# Patient Record
Sex: Male | Born: 1965
Health system: Southern US, Community
[De-identification: ages and names within clinical notes are randomized; demographics above are authoritative.]

## PROBLEM LIST (undated history)

## (undated) DIAGNOSIS — E785 Hyperlipidemia, unspecified: Secondary | ICD-10-CM

## (undated) DIAGNOSIS — E118 Type 2 diabetes mellitus with unspecified complications: Secondary | ICD-10-CM

## (undated) DIAGNOSIS — Z794 Long term (current) use of insulin: Secondary | ICD-10-CM

## (undated) DIAGNOSIS — J45909 Unspecified asthma, uncomplicated: Secondary | ICD-10-CM

## (undated) DIAGNOSIS — E119 Type 2 diabetes mellitus without complications: Secondary | ICD-10-CM

## (undated) DIAGNOSIS — I2510 Atherosclerotic heart disease of native coronary artery without angina pectoris: Secondary | ICD-10-CM

## (undated) DIAGNOSIS — I213 ST elevation (STEMI) myocardial infarction of unspecified site: Secondary | ICD-10-CM

## (undated) HISTORY — DX: Unspecified asthma, uncomplicated: J45.909

## (undated) HISTORY — DX: Atherosclerotic heart disease of native coronary artery without angina pectoris: I25.10

## (undated) HISTORY — DX: Type 2 diabetes mellitus with unspecified complications: E11.8

## (undated) HISTORY — DX: Long term (current) use of insulin: Z79.4

---

## 2005-10-08 ENCOUNTER — Encounter: Admission: RE | Admit: 2005-10-08 | Discharge: 2005-10-08 | Payer: Self-pay | Admitting: Occupational Medicine

## 2006-01-24 ENCOUNTER — Emergency Department (HOSPITAL_COMMUNITY): Admission: EM | Admit: 2006-01-24 | Discharge: 2006-01-24 | Payer: Self-pay | Admitting: Emergency Medicine

## 2006-02-08 ENCOUNTER — Ambulatory Visit: Payer: Self-pay | Admitting: Family Medicine

## 2006-03-25 ENCOUNTER — Emergency Department (HOSPITAL_COMMUNITY): Admission: EM | Admit: 2006-03-25 | Discharge: 2006-03-25 | Payer: Self-pay | Admitting: Family Medicine

## 2006-04-20 ENCOUNTER — Emergency Department (HOSPITAL_COMMUNITY): Admission: EM | Admit: 2006-04-20 | Discharge: 2006-04-20 | Payer: Self-pay | Admitting: Family Medicine

## 2007-11-11 ENCOUNTER — Emergency Department (HOSPITAL_COMMUNITY): Admission: EM | Admit: 2007-11-11 | Discharge: 2007-11-11 | Payer: Self-pay | Admitting: Emergency Medicine

## 2007-11-14 ENCOUNTER — Ambulatory Visit: Payer: Self-pay | Admitting: Family Medicine

## 2007-12-09 ENCOUNTER — Ambulatory Visit: Payer: Self-pay | Admitting: Family Medicine

## 2011-08-17 LAB — POCT RAPID STREP A: Streptococcus, Group A Screen (Direct): NEGATIVE

## 2011-11-26 ENCOUNTER — Ambulatory Visit (INDEPENDENT_AMBULATORY_CARE_PROVIDER_SITE_OTHER): Payer: BC Managed Care – PPO

## 2011-11-26 DIAGNOSIS — Z8611 Personal history of tuberculosis: Secondary | ICD-10-CM

## 2011-11-26 DIAGNOSIS — R05 Cough: Secondary | ICD-10-CM

## 2011-12-18 ENCOUNTER — Ambulatory Visit (INDEPENDENT_AMBULATORY_CARE_PROVIDER_SITE_OTHER): Payer: BC Managed Care – PPO | Admitting: Internal Medicine

## 2011-12-18 VITALS — BP 128/90 | HR 78 | Temp 98.2°F | Resp 16 | Ht 59.0 in | Wt 136.0 lb

## 2011-12-18 DIAGNOSIS — J988 Other specified respiratory disorders: Secondary | ICD-10-CM

## 2011-12-18 DIAGNOSIS — J22 Unspecified acute lower respiratory infection: Secondary | ICD-10-CM

## 2011-12-18 NOTE — Progress Notes (Signed)
  Subjective:    Patient ID: Mark Cole, male    DOB: July 01, 1966, 46 y.o.   MRN: [id number]  HPIhe returns after treatment 1 14 2013 4 bronchospasm. He is a known asthmatic but rarely needs medicines. The treatment was ineffective and he continues to cough both day and night and has shortness of breath with activity. He has no fever and the cough is nonproductive. He has a mild sore throat from coughing and also tender abdominal muscles from coughing.    Review of Systemsnote a past history of tuberculosis which was treated with INH and a normal x-ray 2 weeks ago.     Objective:   Physical Examphysical examination shows intact HEENT except for mild redness in the pharynx. There are no lymph nodes in the neck. The lungs have forced expiratory wheezing bilaterally  And rhonchi at the bases that clear with coughing.        Assessment & Plan:  Problem #1 lower respiratory infection with exacerbation of asthma.  Plan-Zpak Hycodan Prednisone followup if not well in 7 days

## 2012-07-10 ENCOUNTER — Ambulatory Visit (INDEPENDENT_AMBULATORY_CARE_PROVIDER_SITE_OTHER): Payer: BC Managed Care – PPO | Admitting: Emergency Medicine

## 2012-07-10 ENCOUNTER — Ambulatory Visit: Payer: BC Managed Care – PPO

## 2012-07-10 VITALS — BP 121/83 | HR 96 | Temp 97.7°F | Resp 17 | Ht 58.5 in | Wt 131.0 lb

## 2012-07-10 DIAGNOSIS — R059 Cough, unspecified: Secondary | ICD-10-CM

## 2012-07-10 DIAGNOSIS — R070 Pain in throat: Secondary | ICD-10-CM

## 2012-07-10 DIAGNOSIS — J40 Bronchitis, not specified as acute or chronic: Secondary | ICD-10-CM

## 2012-07-10 DIAGNOSIS — R05 Cough: Secondary | ICD-10-CM

## 2012-07-10 DIAGNOSIS — J45909 Unspecified asthma, uncomplicated: Secondary | ICD-10-CM

## 2012-07-10 DIAGNOSIS — J029 Acute pharyngitis, unspecified: Secondary | ICD-10-CM

## 2012-07-10 LAB — POCT RAPID STREP A (OFFICE): Rapid Strep A Screen: NEGATIVE

## 2012-07-10 MED ORDER — ALBUTEROL SULFATE HFA 108 (90 BASE) MCG/ACT IN AERS: 2.0000 | INHALATION_SPRAY | RESPIRATORY_TRACT | Status: DC | PRN

## 2012-07-10 MED ORDER — PREDNISONE 20 MG PO TABS: ORAL_TABLET | ORAL | Status: DC

## 2012-07-10 MED ORDER — ALBUTEROL SULFATE (2.5 MG/3ML) 0.083% IN NEBU
2.5000 mg | INHALATION_SOLUTION | Freq: Once | RESPIRATORY_TRACT | Status: AC
  Administered 2012-07-10: 2.5 mg via RESPIRATORY_TRACT

## 2012-07-10 MED ORDER — AZITHROMYCIN 250 MG PO TABS: ORAL_TABLET | ORAL | Status: DC

## 2012-07-10 MED ORDER — AZITHROMYCIN 250 MG PO TABS: ORAL_TABLET | ORAL | Status: AC

## 2012-07-10 NOTE — Patient Instructions (Addendum)
Vim ph? qu?n (Bronchitis) Vim ph? qu?n l cch c? th? ph?n ?ng l?i s? t?n th??ng v/ho?c nhi?m trng (vim) c?a ph? qu?n. Ph? qu?n l ?ng d?n kh ko di t? kh qu?n ??n ph?i. Khi vim nhi?m n?ng c th? gy kh th?. NGUYN NHN S? vim nhi?m c th? gy ra b?i:  Vi rt.   Vi trng (vi khu?n).   B?i.   Ch?t gy d? ?ng.   Ch?t b?n v nhi?u ch?t kch thch khc.  Cc t? bo t?o thnh cu?ng ph?i ???c bao b?c b?i lng t? (mao). Nh?ng lng t? ny lin t?c ??p ln ra xa ph?i v? pha mi?ng. Nh? ? ng?n khng cho cc ch?t b?n ?i vo ph?i. Khi nh?ng t? bo ny tr? nn qu kch thch v khng th? th?c hi?n ch?c n?ng c?a chng, d?ch nh?y s? b?t ??u pht tri?n. ?i?u ny gy ra ho ??c tr?ng c?a vim ph? qu?n. Ho s? lm s?ch ph?i khi lng mao khng th? th?c hi?n ch?c n?ng c?a chng. Khi khng c c? hai c? c?u b?o v? ny, d?ch nh?y s? ??ng l?i trong ph?i. Khi ? vim ph?i s? pht tri?n.  Ht thu?c l l nguyn nhn ph? bi?n c?a vim ph? qu?n v c th? gp ph?n vo vim ph?i. Vi?c d?ng thi quen ny l m?t ?i?u quan tr?ng nh?t m b?n c th? lm ?? t? gip mnh. ?I?U TR?  Chuyn gia ch?m [place] y t? c th? k thu?c khng sinh cho b?n n?u b?n b? ho do vi khu?n. Ngoi ra, thu?c gip lm gin kh qu?n ?? b?n th? d? h?n. Chuyn gia ch?m [place] y t? c?ng c th? ?? xu?t ho?c k thu?c long ??m. Thu?c long ??m s? lm l?ng d?ch nh?y ?? c th? ho ra. Ch? s? d?ng thu?c mua tr?c ti?p t?i qu?y ho?c thu?c theo toa ?? gi?m ?au, gi?m s? kh ch?u ho?c h? s?t theo ch? d?n c?a chuyn gia ch?m [place] y t? c?a b?n.   Vi?c lo?i b? b?t c? ?i?u g gy ra v?n ?? (v d? nh? ht thu?c l) c  ngh?a quan tr?ng ??i v?i vi?c ng?n khng lm cho v?n ?? t?i t? h?n.   Thu?c ho c th? ???c k ?? ?i?u tr? tri?u ch?ng ho.   Thu?c ht c th? ???c k ?? gip ?i?u tr? cc tri?u ch?ng hi?n t?i v ?? gip ng?n ng?a v?n ?? l?p l?i.   Nh?ng ng??i b? vim ph? qu?n mn tnh (ti h?i) c th? c?n ph?i dng thu?c steroid.  HY NGAY L?P T?C THAM V?N V?I CHUYN  GIA Y T? N?U:  Trong qu trnh ?i?u tr? b?n pht tri?n ??m gi?ng m? h?n (c m?).   B?n b? s?t.   Tr? h?n 3 thng tu?i c nhi?t ?? ?o ? h?u mn l 102 F (38,9 C) ho?c cao h?n.   Tr? 3 thng tu?i ho?c nh? h?n c nhi?t ?? ?o ? h?u mn l 100,4 F (38 C) ho?c cao h?n.   Tnh tr?ng b?nh c?a b?n tr? nn n?ng h?n.   B?n th?y kh th? h?n, th? kh kh hay kh th?.  C?n tham v?n v?i chuyn gia y t? ngay l?p t?c n?u b?n l ng??i l?n tu?i ho?c ?ang b? b?t k? b?nh no khc. ??M B?O B?N:   Hi?u cc h??ng d?n ny.   S? theo di tnh tr?ng c?a mnh.   S? yu c?u tr? gip ngay l?p t?c n?u b?n c?m  th?y khng kh?e ho?c tnh tr?ng tr? nn t?i t? h?n.  Document Released: 10/29/2005 Document Revised: 10/18/2011 [facility] Patient Information 2012 [place], [place].Bronchitis Bronchitis is the body's way of reacting to injury and/or infection (inflammation) of the bronchi. Bronchi are the air tubes that extend from the windpipe into the lungs. If the inflammation becomes severe, it may cause shortness of breath. CAUSES  Inflammation may be caused by:  A virus.   Germs (bacteria).   Dust.   Allergens.   Pollutants and many other irritants.  The cells lining the bronchial tree are covered with tiny hairs (cilia). These constantly beat upward, away from the lungs, toward the mouth. This keeps the lungs free of pollutants. When these cells become too irritated and are unable to do their job, mucus begins to develop. This causes the characteristic cough of bronchitis. The cough clears the lungs when the cilia are unable to do their job. Without either of these protective mechanisms, the mucus would settle in the lungs. Then you would develop pneumonia. Smoking is a common cause of bronchitis and can contribute to pneumonia. Stopping this habit is the single most important thing you can do to help yourself. TREATMENT   Your caregiver may prescribe an antibiotic if the cough is caused by bacteria. Also,  medicines that open up your airways make it easier to breathe. Your caregiver may also recommend or prescribe an expectorant. It will loosen the mucus to be coughed up. Only take over-the-counter or prescription medicines for pain, discomfort, or fever as directed by your caregiver.   Removing whatever causes the problem (smoking, for example) is critical to preventing the problem from getting worse.   Cough suppressants may be prescribed for relief of cough symptoms.   Inhaled medicines may be prescribed to help with symptoms now and to help prevent problems from returning.   For those with recurrent (chronic) bronchitis, there may be a need for steroid medicines.  SEEK IMMEDIATE MEDICAL CARE IF:   During treatment, you develop more pus-like mucus (purulent sputum).   You have a fever.   Your baby is older than 3 months with a rectal temperature of 102 F (38.9 C) or higher.   Your baby is 36 months old or younger with a rectal temperature of 100.4 F (38 C) or higher.   You become progressively more ill.   You have increased difficulty breathing, wheezing, or shortness of breath.  It is necessary to seek immediate medical care if you are elderly or sick from any other disease. MAKE SURE YOU:   Understand these instructions.   Will watch your condition.   Will get help right away if you are not doing well or get worse.  Document Released: 10/29/2005 Document Revised: 10/18/2011 Document Reviewed: 09/07/2008 [facility] Patient Information 2012 [place], [place].

## 2012-07-10 NOTE — Progress Notes (Signed)
  Subjective:    Patient ID: Mark Cole, male    DOB: 01-16-1966, 46 y.o.   MRN: [id number]  HPI patient states he has no history of problems until 2 weeks ago he started having a sore throat and itching sensation in his throat as well as a bad cough. He states he's had some wheezing with this he is unable to stop coughing and coughs up only small amounts of phlegm.    Review of Systems     Objective:   Physical Exam  Constitutional: He appears well-developed and well-nourished.  HENT:  Head: Normocephalic.  Right Ear: External ear normal.  Left Ear: External ear normal.       The throat is red but I did not see any exudate. He did not have cervical adenopathy.  Eyes: Pupils are equal, round, and reactive to light.  Neck: No thyromegaly present.  Cardiovascular: Normal rate and regular rhythm.   Pulmonary/Chest: Effort normal. He has wheezes. He has no rales.       Decreased breath sounds in the bases   Results for orders placed in visit on 07/10/12  POCT RAPID STREP A (OFFICE)      Component Value Range   Rapid Strep A Screen Negative  Negative     [facility] reading (PRIMARY) by  Dr. Cleta Alberts chest x-ray shows no acute disease .     Assessment & Plan:  I suspect his symptoms are allergy related. He did improve with nebulizer treatment. We'll treat with a short taper of prednisone as well as a Z-Pak and see if this will resolve his symptoms. His pulse ox was slightly low at 95%

## 2013-09-06 ENCOUNTER — Ambulatory Visit (INDEPENDENT_AMBULATORY_CARE_PROVIDER_SITE_OTHER): Payer: Self-pay | Admitting: Emergency Medicine

## 2013-09-06 VITALS — BP 130/90 | HR 75 | Temp 98.0°F | Resp 16 | Ht 58.5 in | Wt 135.0 lb

## 2013-09-06 DIAGNOSIS — J45909 Unspecified asthma, uncomplicated: Secondary | ICD-10-CM

## 2013-09-06 MED ORDER — ALBUTEROL SULFATE HFA 108 (90 BASE) MCG/ACT IN AERS: 2.0000 | INHALATION_SPRAY | RESPIRATORY_TRACT | Status: DC | PRN

## 2013-09-06 NOTE — Patient Instructions (Signed)
Mark Cole, Mark Cole  (Asthma, Adult)  Mark Cole l tnh tr?ng ?nh h??ng t?i ph?i. N ???c ??c tr?ng b?i s?ng v h?p ???ng h h?p c?ng nh? t?ng ti?t d?ch nh?y. H?p l do s?ng v co th?t c? bn trong ???ng h h?p. Khi ?i?u ny x?y ra, th? c th? kh kh?n v b?n c th? c ho, th? kh kh v th? d?c. Cng hi?u nhi?u v? Mark Cole th cng c th? gip b?n ch? ng? n t?t h?n. Khng th? ch?a kh?i Mark Cole, nh?ng thu?c v thay ??i cch s?ng c th? gip ki?m sot n. Mark Cole c th? l v?n ?? nh? ??i v?i m?t s? Mark nh?ng n?u Mark Cole khng ???c ki?m sot , n c th? d?n t?i c?n Mark ?e d?a ??n tnh m?ng. Mark Cole c th? thay ??i theo th?i gian. ?i?u quan tr?ng l c?n ph?i h?p v?i chuyn gia ch?m [place] y t? c?a b?n ?? ch? ng? tri?u ch?ng Mark Cole. NGUYN NHN  Nguyn nhn chnh xc gy Mark Cole khng ???c xc ??nh. Mark Cole ???c cho l do di truy?n (di truy?n) v ti?p xc v?i mi tr??ng. S?ng v t?y ?? (vim) ???ng h h?p x?y ra trong Mark Cole. ?i?u ny c th? b? kch thch b?i d? ?ng, nhi?m trng ph?i do vi rt, ho?c cc ch?t kch thch trong khng kh. Ph?n ?ng d? ?ng c th? khi?n b?n th? kh kh ngay l?p t?c ho?c vi gi? sau khi ti?p xc. Tc nhn gy Mark Cole khc nhau ??i v?i t?ng Mark. ?i?u quan tr?ng l ph?i ch  v bi?t nguyn nhn gy ra Mark Cole.  Tc nhn ph? bi?n gy ra c?n Mark bao g?m:   B?i b?n t? da, tc ??ng v?t ho?c lng ??ng v?t.  M?t b?i c trong b?i trong nh.  Gin.  Ph?n hoa t? cy ho?c c?.  N?m m?c.  Thu?c l ho?c khi thu?c l. Khng th? cho php ht thu?c trong nh c Mark b? Mark Cole. Mark b? Mark Cole khng nn ht thu?c v khng nn ? g?n Mark ht thu?c.  Cc ch?t  nhi?m khng kh nh? b?i, ch?t t?y r?a gia d?ng, ch?t x?t tc, ch?t phun x?t, h?i s?n, ha ch?t m?nh ho?c mi m?nh.  Khng kh l?nh ho?c thay ??i th?i ti?t. Khng kh l?nh c th? gy vim. Gi lm t?ng n?m m?c, ph?n hoa trong khng kh. Khng c m?t kh h?u t?t nh?t cho Mark b? Mark Cole.  Nh?ng c?m  xc m?nh nh? khc ho?c c??i nhi?u.  S? c?ng th?ng.  M?t s? lo?i thu?c c? th? nh? atpirin ho?c thu?c ch?n beta.  [employer] trong cc lo?i th?c ph?m v ?? u?ng nh? tri cy kh v r??u vang.  Cc tnh tr?ng nhi?m trng ho?c vim nh? cm, c?m l?nh ho?c vim mng m?i (vim m?i ).  B?nh tro ng??c d? dy th?c qu?n (GERD). GERD l tnh tr?ng axit trong d? dy tro ng??c ln c? h?ng (th?c qu?n).  T?p luy?n ho?c ho?t ??ng v?t v?. Thu?c tr??c khi t?p th? d?c thch h?p cho php h?u h?t m?i Mark tham gia cc mn th? thao. TRI?U CH?NG   C?m th?y th? d?c.  T?c ho?c ?au ng?c.  Kh ng? do ho, th? kh kh ho?c c?m th?y th? d?c.  m thanh hut so hay th? kh kh v?i vi?c th? ra.  Ho ho?c th? kh kh t? h?n khi b?n:  B? nhi?m vi  rt (nh? c?m l?nh ho?c cm).  ?ang b? d? ?ng.  Ti?p xc v?i m?t s? lo?i h?i ho?c ha ch?t nh?t ??nh.  T?p luy?n. D?u hi?u cho th?y Mark Cole c th? tr? nn t? h?n bao g?m:   D?u hi?u v tri?u ch?ng c?a Mark Cole th??ng xuyn h?n v kh ch?u.  T?ng hi?n t??ng kh th?. Hi?n t??ng ny c th? ???c ?o b?ng my ?o l?u l??ng ??nh, ?y l m?t thi?t b? ??n gi?n ???c s? d?ng ?? ki?m tra xem ph?i c?a b?n ?ang ho?t ??ng nh? th? no.  Ngy cng c?n s? d?ng ?ng ht gi?m ?au nhanh th??ng xuyn h?n. CH?N ?ON  Vi?c ch?n ?on Mark Cole ???c th?c hi?n b?ng cch xem xt b?nh s? c?a b?n, khm tr?c ti?p v c th? t? cc xt nghi?m khc. Nghin c?u ch?c n?ng ph?i c th? gip ch?n ?on.  ?I?U TR?  Khng th? ch?a kh?i Mark Cole. Tuy nhin, ??i v?i ?a ph?n Mark Cole, Mark Cole c th? ???c ki?m sot b?ng ?i?u tr?. Bn c?nh vi?c trnh cc tc nhn gy Mark Cole, th??ng c?n dng c? thu?c. C 2 lo?i thu?c ???c s? d?ng ?? ?i?u tr? Mark Cole: thu?c ki?m sot (gi?m vim v tri?u ch?ng) v thu?c lm gi?m ho?c thu?c gi?i c?u (gi?m tri?u ch?ng Mark Cole trong c?n Mark c?p tnh). B?n c th? c?n dng thu?c hng ngy ?? ki?m sot Mark Cole. Cc lo?i thu?c ki?m sot lu di hi?u qu? nh?t cho Mark Cole l  corticosteroid ?? ht (ng?n ch?n vim). Cc lo?i thu?c ki?m sot lu di khc bao g?m:   Thu?c khng leukotriene (ch?n ???ng c?a tnh tr?ng vim).  Cc lo?i thu?c tc d?ng di h?n (th? gin c? b?p c?a ???ng h h?p t nh?t 12 gi?) v?i corticosteroid dng ?? ht.  Cromolyn natri ho?c nedocromil (lm thay ??i kh? n?ng gi?i phng cc ha ch?t gy vim c?a m?t s? t? bo b? vim).  Immunomodulator (lm thay ??i h? th?ng mi?n d?ch ?? ng?n ch?n cc tri?u ch?ng Mark Cole).  Theophylline (th? gin c? trong ???ng h h?p). B?n c?ng c th? yu c?u m?t lo?i thu?c tc d?ng ng?n h?n ?? lm gi?m cc tri?u ch?ng Mark Cole trong c?n Mark c?p tnh.  B?n nn hi?u ph?i lm g trong c?n Mark c?p tnh. Thu?c ht c hi?u qu? khi ???c s? d?ng ?ng cch. ??c h??ng d?n v? cch s? d?ng thu?c c?a b?n m?t cch chnh xc v ni chuy?n v?i chuyn gia ch?m [place] y t? n?u b?n c cu h?i. ??n g?p chuyn gia ch?m [place] y t? ?? khm l?i theo ??nh k? ?? ??m b?o b?nh Mark Cole c?a b?n ???c ki?m sot t?t. N?u Mark Cole khng ???c ki?m sot t?t, n?u b?n b? nh?p vi?n v Mark Cole, ho?c n?u c?n nhi?u lo?i thu?c ho?c corticosteroid dng ?? ht li?u v?a ??n li?u cao ?? ki?m sot b?nh Mark Cole c?a b?n, hy yu c?u gi?i thi?u ??n m?t chuyn gia v? b?nh Mark Cole.  H??NG D?N CH?M [place] T?I NH   S? d?ng thu?c theo ch? d?n c?a chuyn gia ch?m [place] y t?.  Ki?m sot mi tr??ng trong nh c?a b?n theo nh?ng cch sau ?? gip ng?n ch?n c?n Mark:  Thay b? l?c l s??i v ?i?u ha khng kh t nh?t m?t Cole m?i thng.  ??t b? l?c ho?c mi?ng v?i th?a trn cc l? thng h?i c?a l s??i v ?i?u ha khng kh.  H?n ch? s? d?ng l s??i v b?p c?i.  Khng ht thu?c l. Khng ? nh?ng n?i c Mark khc ?ang ht thu?c.  Di?t v?t gy h?i (ch?ng h?n nh? gin v chu?t) v phn c?a chng.  N?u b?n nhn th?y n?m m?c trn cy, hy v?t n ?i.  Lau sn nh v ht b?i m?i tu?n. S? d?ng s?n ph?m lm s?ch khng mi. S? d?ng my ht b?i s?ch h?n v?i b? l?c HEPA, n?u c th?.  N?u vi?c ht b?i ho?c d?n d?p gy ra Mark Cole cho b?n, hy c? g?ng tm Mark no khc ?? lm cc vi?c v?t ny.  Sn nh nn lm b?ng g?, ?t lt ho?c nh?a vinyl. Th?m c th? dnh lng v b?i.  S? d?ng g?i, ga tr?i gi??ng v ga tr?i n?m l xo ch?ng d? ?ng.  Gi?t kh?n tr?i gi??ng v ch?n hng tu?n trong n??c nng v lm kh trong my s?y.  S? d?ng ch?n ???c lm b?ng s?i t?ng h?p ho?c bng v?i d?t tuy?t ch?t.  Khng s? d?ng di?m ng?n cho gi??ng.  V? sinh phng t?m v nh b?p b?ng thu?c t?y v s?n l?i b?ng s?n ch?ng n?m m?c.  R?a tay th??ng xuyn.  Ni chuy?n v?i chuyn gia ch?m [place] y t? c?a b?n v? m?t k? ho?ch hnh ??ng ?? qu?n l c?n Mark. ?i?u ny bao g?m vi?c s? d?ng m?t my ?o l?u l??ng ??nh ?? ?o m?c ?? nghim tr?ng c?a c?n Mark v cc lo?i thu?c c th? gip ng?n ch?n c?n Mark. M?t k? ho?ch hnh ??ng c th? gip gi?m thi?u ho?c ng?n ch?n cc c?n Mark m khng c?n ph?i nh? ??n s? ch?m [place] y t?.  Gi? bnh t?nh trong c?n Mark.  Lun c m?t k? ho?ch chu?n b? tham v?n v?i chuyn gia y t?. ?i?u ny bao g?m c? vi?c lin h? v?i chuyn gia ch?m [place] y t? c?a b?n v trong tr??ng h?p b? c?n Mark n?ng, hy g?i d?ch v? c?p c?u t?i ??a ph??ng (911 t?i [place] HY THAM V?N V?I CHUYN GIA Y T? N?U:   B?n th? kh kh, th? d?c ho?c ho ngay c? khi ? dng thu?c ?? ng?n ch?n c?n Mark.  B?n c ?m dy.  ?m c?a b?n thay ??i t? khng mu (trong) ho?c mu tr?ng sang mu vng, xanh, xm ho?c c mu.  B?n c b?t k? v?n ?? no lin quan ??n thu?c ?ang s? d?ng (ch?ng h?n nh? pht ban, ng?a, s?ng ho?c kh th?).  B?n s? d?ng thu?c lm gi?m Mark Cole nhi?u h?n 2-3 Cole m?i tu?n.  L?u l??ng ??nh c?a b?n v?n ? kho?ng 50-79% ch? s? c nhn t?t nh?t sau khi lm theo k? ho?ch hnh ??ng c?a b?n trong 1 gi?. HY NGAY L?P T?C THAM V?N V?I CHUYN GIA Y T? N?U:   B?n th? d?c ngay c? khi ngh?Marland Kitchen  B?n th? d?c khi th?c hi?n ho?t ??ng th? ch?t r?t t.  B?n g?p kh kh?n khi ?n, u?ng ho?c ni chuy?n do cc tri?u ch?ng Mark  Cole.  B?n b? ?au ng?c ho?c c?m th?y tim ??p nhanh.  Mi ho?c mng tay c?a b?n c mu xanh nh?t.  B?n b? ?au ??u, chng m?t ho?c ng?t x?u.  B?n b? s?t ho?c c cc tri?u ch?ng ko di h?n 2-3 ngy.  B?n b? s?t v cc tri?u ch?ng ??t nhin x?u ?i.  B?n d??ng nh? tr? nn t? h?n v khng ?p ?  ng v?i vi?c ?i?u tr? trong c?n Mark.  L?u l??ng ??nh c?a b?n d??i 50% ch? s? c nhn t?t nh?t. ??M B?O B?N:   Hi?u cc h??ng d?n ny.  S? theo di tnh tr?ng c?a mnh.  S? yu c?u tr? gip ngay l?p t?c n?u b?n c?m th?y khng kh?e ho?c tnh tr?ng tr? nn t?i h?n. Document Released: 10/29/2005 Document Revised: 10/15/2012 [facility] Patient Information 2014 [place], [place].

## 2013-09-06 NOTE — Progress Notes (Signed)
[facility] [address] [place] [address] [phone]  Date:  09/06/2013   Name:  Lenardo Westwood   DOB:  18-May-1966   MRN:  [id number]  PCP:  No PCP Per Patient    Chief Complaint: Breathing Problem   History of Present Illness:  Kramer Hanrahan is a 47 y.o. very pleasant male patient who presents with the following:  History of asthma and has run out of his MDI.  Not sure how often he uses his inhaler.  Not daily nor weekly.  No fever or chills, no nausea or vomiting.  No stool change.  No cough.  No improvement with over the counter medications or other home remedies. Denies other complaint or health concern today.   There are no active problems to display for this patient.   History reviewed. No pertinent past medical history.  History reviewed. No pertinent past surgical history.  History  Substance Use Topics  . Smoking status: Current Some Day Smoker    Types: Cigarettes  . Smokeless tobacco: Not on file  . Alcohol Use: No    History reviewed. No pertinent family history.  No Known Allergies  Medication list has been reviewed and updated.  Current Outpatient Prescriptions on File Prior to Visit  Medication Sig Dispense Refill  . albuterol (PROVENTIL HFA;VENTOLIN HFA) 108 (90 BASE) MCG/ACT inhaler Inhale 2 puffs into the lungs every 4 (four) hours as needed for wheezing (cough, shortness of breath or wheezing.).  1 Inhaler  1   No current facility-administered medications on file prior to visit.    Review of Systems:  As per HPI, otherwise negative.    Physical Examination: Filed Vitals:   09/06/13 1036  BP: 130/90  Pulse: 75  Temp: 98 F (36.7 C)  Resp: 16   Filed Vitals:   09/06/13 1036  Height: 4' 10.5" (1.486 m)  Weight: 135 lb (61.236 kg)   Body mass index is 27.73 kg/(m^2). Ideal Body Weight: Weight in (lb) to have BMI = 25: 121.4  GEN: WDWN, NAD, Non-toxic, A & O x 3 HEENT: Atraumatic, Normocephalic. Neck supple. No  masses, No LAD. Ears and Nose: No external deformity. CV: RRR, No M/G/R. No JVD. No thrill. No extra heart sounds. PULM: CTA B, no wheezes, crackles, rhonchi. No retractions. No resp. distress. No accessory muscle use. ABD: S, NT, ND, +BS. No rebound. No HSM. EXTR: No c/c/e NEURO Normal gait.  PSYCH: Normally interactive. Conversant. Not depressed or anxious appearing.  Calm demeanor.    Assessment and Plan: Asthma Refill   Signed,  Phillips Odor, MD

## 2013-09-08 ENCOUNTER — Ambulatory Visit (INDEPENDENT_AMBULATORY_CARE_PROVIDER_SITE_OTHER): Payer: No Typology Code available for payment source | Admitting: Emergency Medicine

## 2013-09-08 VITALS — BP 107/76 | HR 81 | Temp 98.0°F | Resp 20 | Ht 58.5 in | Wt 131.0 lb

## 2013-09-08 DIAGNOSIS — R1013 Epigastric pain: Secondary | ICD-10-CM

## 2013-09-08 LAB — POCT URINALYSIS DIPSTICK
Bilirubin, UA: NEGATIVE
Blood, UA: NEGATIVE
Glucose, UA: NEGATIVE
Ketones, UA: NEGATIVE
Leukocytes, UA: NEGATIVE
Nitrite, UA: NEGATIVE
Protein, UA: NEGATIVE
Spec Grav, UA: 1.025
Urobilinogen, UA: 0.2
pH, UA: 6

## 2013-09-08 LAB — POCT CBC
Granulocyte percent: 63.6 %G (ref 37–80)
HCT, POC: 48.6 % (ref 43.5–53.7)
Hemoglobin: 15.7 g/dL (ref 14.1–18.1)
MCH, POC: 25.2 pg — AB (ref 27–31.2)
MCHC: 32.3 g/dL (ref 31.8–35.4)
MCV: 78.1 fL — AB (ref 80–97)
MID (cbc): 0.8 (ref 0–0.9)
MPV: 8.6 fL (ref 0–99.8)
POC Granulocyte: 6.2 (ref 2–6.9)
POC LYMPH PERCENT: 27.8 %L (ref 10–50)
Platelet Count, POC: 391 10*3/uL (ref 142–424)
RBC: 6.22 M/uL — AB (ref 4.69–6.13)
RDW, POC: 14.2 %

## 2013-09-08 LAB — AMYLASE: Amylase: 66 U/L (ref 0–105)

## 2013-09-08 LAB — COMPREHENSIVE METABOLIC PANEL
ALT: 19 U/L (ref 0–53)
AST: 18 U/L (ref 0–37)
Albumin: 4.6 g/dL (ref 3.5–5.2)
Alkaline Phosphatase: 47 U/L (ref 39–117)
BUN: 13 mg/dL (ref 6–23)
CO2: 28 mEq/L (ref 19–32)
Calcium: 11 mg/dL — ABNORMAL HIGH (ref 8.4–10.5)
Chloride: 100 mEq/L (ref 96–112)
Creat: 0.99 mg/dL (ref 0.50–1.35)
Glucose, Bld: 106 mg/dL — ABNORMAL HIGH (ref 70–99)
Potassium: 4.3 mEq/L (ref 3.5–5.3)
Sodium: 139 mEq/L (ref 135–145)
Total Protein: 7.5 g/dL (ref 6.0–8.3)

## 2013-09-08 LAB — LIPASE: Lipase: 10 U/L (ref 0–75)

## 2013-09-08 MED ORDER — LANSOPRAZOLE 30 MG PO CPDR: 30.0000 mg | DELAYED_RELEASE_CAPSULE | Freq: Every day | ORAL | Status: DC

## 2013-09-08 MED ORDER — ONDANSETRON 8 MG PO TBDP: 8.0000 mg | ORAL_TABLET | Freq: Three times a day (TID) | ORAL | Status: DC | PRN

## 2013-09-08 NOTE — Progress Notes (Signed)
[facility] [address] [place] [address] [phone]  Date:  09/08/2013   Name:  Mark Cole   DOB:  06-Aug-1966   MRN:  [id number]  PCP:  No PCP Per Patient    Chief Complaint: Abdominal Pain   History of Present Illness:  Mark Cole is a 47 y.o. very pleasant male patient who presents with the following:  Well until Sunday when he developed pain in the epigastrium and nausea and vomiting.  No stool change, fever or chills.  Not icteric.  No food intolerance.  No excess caffeine or nsaids.  Non smoker or drinker.  No improvement with over the counter medications or other home remedies. Denies other complaint or health concern today. 3  There are no active problems to display for this patient.   History reviewed. No pertinent past medical history.  History reviewed. No pertinent past surgical history.  History  Substance Use Topics  . Smoking status: Current Some Day Smoker    Types: Cigarettes  . Smokeless tobacco: Not on file  . Alcohol Use: No    History reviewed. No pertinent family history.  No Known Allergies  Medication list has been reviewed and updated.  Current Outpatient Prescriptions on File Prior to Visit  Medication Sig Dispense Refill  . albuterol (PROVENTIL HFA;VENTOLIN HFA) 108 (90 BASE) MCG/ACT inhaler Inhale 2 puffs into the lungs every 4 (four) hours as needed for wheezing (cough, shortness of breath or wheezing.).  1 Inhaler  12   No current facility-administered medications on file prior to visit.    Review of Systems:  As per HPI, otherwise negative.    Physical Examination: Filed Vitals:   09/08/13 1051  BP: 107/76  Pulse: 81  Temp: 98 F (36.7 C)  Resp: 20   Filed Vitals:   09/08/13 1051  Height: 4' 10.5" (1.486 m)  Weight: 131 lb (59.421 kg)   Body mass index is 26.91 kg/(m^2). Ideal Body Weight: Weight in (lb) to have BMI = 25: 121.4  GEN: WDWN, NAD, Non-toxic, A & O x 3 HEENT: Atraumatic,  Normocephalic. Neck supple. No masses, No LAD. Ears and Nose: No external deformity. CV: RRR, No M/G/R. No JVD. No thrill. No extra heart sounds. PULM: CTA B, no wheezes, crackles, rhonchi. No retractions. No resp. distress. No accessory muscle use. ABD: S, NT, ND, +BS. No rebound. No HSM. EXTR: No c/c/e NEURO Normal gait.  PSYCH: Normally interactive. Conversant. Not depressed or anxious appearing.  Calm demeanor.    Assessment and Plan: Abdominal pain Nausea and vomiting zofran Prevacid Follow up based on labs  Signed,  Phillips Odor, MD   Results for orders placed in visit on 09/08/13  POCT CBC      Result Value Range   WBC 9.7  4.6 - 10.2 K/uL   Lymph, poc 2.7  0.6 - 3.4   POC LYMPH PERCENT 27.8  10 - 50 %L   MID (cbc) 0.8  0 - 0.9   POC MID % 8.6  0 - 12 %M   POC Granulocyte 6.2  2 - 6.9   Granulocyte percent 63.6  37 - 80 %G   RBC 6.22 (*) 4.69 - 6.13 M/uL   Hemoglobin 15.7  14.1 - 18.1 g/dL   HCT, POC 30.8  65.7 - 53.7 %   MCV 78.1 (*) 80 - 97 fL   MCH, POC 25.2 (*) 27 - 31.2 pg   MCHC 32.3  31.8 - 35.4 g/dL  RDW, POC 14.2     Platelet Count, POC 391  142 - 424 K/uL   MPV 8.6  0 - 99.8 fL  POCT URINALYSIS DIPSTICK      Result Value Range   Color, UA orange     Clarity, UA slightly cloudy     Glucose, UA neg     Bilirubin, UA neg     Ketones, UA neg     Spec Grav, UA 1.025     Blood, UA neg     pH, UA 6.0     Protein, UA neg     Urobilinogen, UA 0.2     Nitrite, UA neg     Leukocytes, UA Negative

## 2013-09-08 NOTE — Patient Instructions (Signed)

## 2013-09-09 LAB — H. PYLORI ANTIBODY, IGG: H Pylori IgG: 0.76 {ISR}

## 2013-09-10 NOTE — Progress Notes (Signed)
PA approved for lansoprazole 30 mg through 09/09/14. Notified pharmacy.

## 2013-11-14 ENCOUNTER — Ambulatory Visit: Payer: No Typology Code available for payment source | Admitting: Internal Medicine

## 2013-11-14 VITALS — BP 130/80 | HR 78 | Temp 98.8°F | Resp 16 | Ht 59.0 in | Wt 137.0 lb

## 2013-11-14 DIAGNOSIS — R059 Cough, unspecified: Secondary | ICD-10-CM

## 2013-11-14 DIAGNOSIS — J45909 Unspecified asthma, uncomplicated: Secondary | ICD-10-CM

## 2013-11-14 DIAGNOSIS — R05 Cough: Secondary | ICD-10-CM

## 2013-11-14 DIAGNOSIS — J9801 Acute bronchospasm: Secondary | ICD-10-CM

## 2013-11-14 HISTORY — DX: Unspecified asthma, uncomplicated: J45.909

## 2013-11-14 MED ORDER — AZITHROMYCIN 250 MG PO TABS: ORAL_TABLET | ORAL | Status: DC

## 2013-11-14 MED ORDER — HYDROCODONE-HOMATROPINE 5-1.5 MG/5ML PO SYRP: 5.0000 mL | ORAL_SOLUTION | Freq: Four times a day (QID) | ORAL | Status: DC | PRN

## 2013-11-14 MED ORDER — RANITIDINE HCL 150 MG PO CAPS: 150.0000 mg | ORAL_CAPSULE | Freq: Two times a day (BID) | ORAL | Status: DC

## 2013-11-14 MED ORDER — PREDNISONE 20 MG PO TABS
ORAL_TABLET | ORAL | Status: DC
Start: 2013-11-14 — End: 2013-12-06

## 2013-11-14 NOTE — Progress Notes (Signed)
   Subjective:    Patient ID: Mark Cole, male    DOB: [birth year], 48 y.o.   MRN: [id number]  HPI started with a sore throat with fever 2 weeks ago For the last week has had a continual cough, especially when active-nonproductive No fever/no night sweats/no weight loss No nasal congestion Sore throat resolved Appetite is decreased but otherwise no symptoms in the GI tract  He has a history of reactive airway disease that has been given albuterol in the past  Review of Systems Noncontributory    Objective:   Physical Exam BP 130/80  Pulse 78  Temp(Src) 98.8 F (37.1 C) (Oral)  Resp 16  Ht 4\' 11"  (1.499 m)  Wt 137 lb (62.143 kg)  BMI 27.66 kg/m2  SpO2 97% HEENT is clear No cervical nodes Chest with wheezing bilaterally on forced expiration Heart regular without murmur Extremities clear       Assessment & Plan:

## 2013-12-06 ENCOUNTER — Ambulatory Visit: Payer: Self-pay | Admitting: Internal Medicine

## 2013-12-06 ENCOUNTER — Ambulatory Visit: Payer: Self-pay

## 2013-12-06 VITALS — BP 118/72 | HR 93 | Temp 99.5°F | Resp 16 | Ht 59.0 in | Wt 131.4 lb

## 2013-12-06 DIAGNOSIS — R05 Cough: Secondary | ICD-10-CM

## 2013-12-06 DIAGNOSIS — R059 Cough, unspecified: Secondary | ICD-10-CM

## 2013-12-06 DIAGNOSIS — J45909 Unspecified asthma, uncomplicated: Secondary | ICD-10-CM

## 2013-12-06 DIAGNOSIS — J9801 Acute bronchospasm: Secondary | ICD-10-CM

## 2013-12-06 HISTORY — DX: Unspecified asthma, uncomplicated: J45.909

## 2013-12-06 LAB — POCT CBC
GRANULOCYTE PERCENT: 72.5 % (ref 37–80)
HCT, POC: 46.7 % (ref 43.5–53.7)
Hemoglobin: 14.6 g/dL (ref 14.1–18.1)
Lymph, poc: 1.5 (ref 0.6–3.4)
MCH, POC: 24.6 pg — AB (ref 27–31.2)
MCHC: 31.3 g/dL — AB (ref 31.8–35.4)
MCV: 78.7 fL — AB (ref 80–97)
MID (cbc): 0.6 (ref 0–0.9)
MPV: 8.1 fL (ref 0–99.8)
POC Granulocyte: 5.4 (ref 2–6.9)
POC LYMPH PERCENT: 19.9 %L (ref 10–50)
POC MID %: 7.6 %M (ref 0–12)
Platelet Count, POC: 308 10*3/uL (ref 142–424)
RBC: 5.94 M/uL (ref 4.69–6.13)
RDW, POC: 14.2 %
WBC: 7.5 10*3/uL (ref 4.6–10.2)

## 2013-12-06 MED ORDER — AZITHROMYCIN 250 MG PO TABS: ORAL_TABLET | ORAL | Status: DC

## 2013-12-06 MED ORDER — ALBUTEROL SULFATE (2.5 MG/3ML) 0.083% IN NEBU
2.5000 mg | INHALATION_SOLUTION | Freq: Once | RESPIRATORY_TRACT | Status: AC
  Administered 2013-12-06: 2.5 mg via RESPIRATORY_TRACT

## 2013-12-06 MED ORDER — PREDNISONE 20 MG PO TABS: ORAL_TABLET | ORAL | Status: DC

## 2013-12-06 MED ORDER — MONTELUKAST SODIUM 10 MG PO TABS: 10.0000 mg | ORAL_TABLET | Freq: Every day | ORAL | Status: DC

## 2013-12-06 MED ORDER — HYDROCODONE-HOMATROPINE 5-1.5 MG/5ML PO SYRP: 5.0000 mL | ORAL_SOLUTION | Freq: Four times a day (QID) | ORAL | Status: DC | PRN

## 2013-12-06 NOTE — Progress Notes (Addendum)
Subjective:    Patient ID: Mark Cole, male    DOB: 1966-03-27, 47 y.o.   MRN: [id number]  HPI This chart was scribed for [name], MD by Andrew Au, ED Scribe. This patient was seen in room 3 and the patient's care was started at 10:54 AM.  HPI Comments: Mark Cole is a 48 y.o. male who presents to the [facility] complaining of a constant worsening cough onset 3 weeks. Pt was seen 3 weeks ago for the same symptoms, he reports the symptoms went away for a while but have returned. He reports associated CP and fever. He states that he has no nasal congetion. Pt reports that the cough is keeping him awake at night. Pt reports left flank pain when walking. He reports he has been using his inhaler but not everyday. Note history of recurrent problems like this over the last year He does not use an inhaler with any consistency between episodes He has never been on inhaled steroids  History reviewed. No pertinent past medical history. No Known Allergies Prior to Admission medications   Medication Sig Start Date End Date Taking? Authorizing Provider  albuterol (PROVENTIL HFA;VENTOLIN HFA) 108 (90 BASE) MCG/ACT inhaler Inhale 2 puffs into the lungs every 4 (four) hours as needed for wheezing (cough, shortness of breath or wheezing.). 09/06/13 11/03/14 Yes Phillips Odor, MD  ranitidine (ZANTAC) 150 MG capsule Take 1 capsule (150 mg total) by mouth 2 (two) times daily. 11/14/13  Yes Tonye Pearson, MD  azithromycin (ZITHROMAX) 250 MG tablet As packaged 11/14/13   Tonye Pearson, MD  HYDROcodone-homatropine [facility]) 5-1.5 MG/5ML syrup Take 5 mLs by mouth every 6 (six) hours as needed for cough. 11/14/13   Tonye Pearson, MD  ondansetron (ZOFRAN-ODT) 8 MG disintegrating tablet Take 1 tablet (8 mg total) by mouth every 8 (eight) hours as needed for nausea. 09/08/13   Phillips Odor, MD  predniSONE (DELTASONE) 20 MG tablet 3/3/2/2/1/1 single daily dose for 6 d 11/14/13    Tonye Pearson, MD     Review of Systems  Constitutional: Positive for fever.  HENT: Negative for congestion and sinus pressure.   Respiratory: Positive for cough.   Cardiovascular: Positive for chest pain.  Genitourinary: Positive for flank pain.       Objective:   Physical Exam  Constitutional: He appears well-developed and well-nourished.  HENT:  Head: Atraumatic.  Eyes: Conjunctivae are normal.  Cardiovascular: Normal rate and regular rhythm.   Pulmonary/Chest: He has wheezes.  Wheezing on expiration bilaterally   Treatment with nebulization cleared lung fields but he continued to have discomfort in the lower ribs with coughing    Meds ordered this encounter  Medications  . albuterol (PROVENTIL) (2.5 MG/3ML) 0.083% nebulizer solution 2.5 mg    Sig:   . montelukast (SINGULAIR) 10 MG tablet    Sig: Take 1 tablet (10 mg total) by mouth at bedtime.    Dispense:  30 tablet    Refill:  5  . azithromycin (ZITHROMAX) 250 MG tablet    Sig: As packaged    Dispense:  6 tablet    Refill:  0  . HYDROcodone-homatropine (HYCODAN) 5-1.5 MG/5ML syrup    Sig: Take 5 mLs by mouth every 6 (six) hours as needed for cough.    Dispense:  120 mL    Refill:  0  . predniSONE (DELTASONE) 20 MG tablet    Sig: 3/3/2/2/1/1 single daily dose for 6 d  Dispense:  12 tablet    Refill:  0   [facility] reading (PRIMARY) by  Dr. Kariann Wecker=NAD Results for orders placed in visit on 12/06/13  POCT CBC      Result Value Range   WBC 7.5  4.6 - 10.2 K/uL   Lymph, poc 1.5  0.6 - 3.4   POC LYMPH PERCENT 19.9  10 - 50 %L   MID (cbc) 0.6  0 - 0.9   POC MID % 7.6  0 - 12 %M   POC Granulocyte 5.4  2 - 6.9   Granulocyte percent 72.5  37 - 80 %G   RBC 5.94  4.69 - 6.13 M/uL   Hemoglobin 14.6  14.1 - 18.1 g/dL   HCT, POC 29.546.7  62.143.5 - 53.7 %   MCV 78.7 (*) 80 - 97 fL   MCH, POC 24.6 (*) 27 - 31.2 pg   MCHC 31.3 (*) 31.8 - 35.4 g/dL   RDW, POC 30.814.2     Platelet Count, POC 308  142 - 424 K/uL   MPV  8.1  0 - 99.8 fL        Assessment & Plan:  I have reviewed and agree with documentation. Reginal Wojcicki P. Merla Richesoolittle, M.D.  Cough - Plan: POCT CBC, albuterol (PROVENTIL) (2.5 MG/3ML) 0.083% nebulizer solution 2.5 mg, DG Chest 2 View, azithromycin (ZITHROMAX) 250 MG tablet  Intrinsic asthma - Plan: HYDROcodone-homatropine (HYCODAN) 5-1.5 MG/5ML syrup, predniSONE (DELTASONE) 20 MG tablet  Constipation likely  Meds ordered this encounter  Medications  . albuterol (PROVENTIL) (2.5 MG/3ML) 0.083% nebulizer solution 2.5 mg    Sig:   . montelukast (SINGULAIR) 10 MG tablet    Sig: Take 1 tablet (10 mg total) by mouth at bedtime.    Dispense:  30 tablet    Refill:  5  . azithromycin (ZITHROMAX) 250 MG tablet    Sig: As packaged    Dispense:  6 tablet    Refill:  0  . HYDROcodone-homatropine (HYCODAN) 5-1.5 MG/5ML syrup    Sig: Take 5 mLs by mouth every 6 (six) hours as needed for cough.    Dispense:  120 mL    Refill:  0  . predniSONE (DELTASONE) 20 MG tablet    Sig: 3/3/2/2/1/1 single daily dose for 6 d    Dispense:  12 tablet    Refill:  0   He needs to be on consistent long-term therapy for inflammatory disease but cannot afford inhaled steroids since he has no insurance so we will try Singulair

## 2015-12-08 ENCOUNTER — Ambulatory Visit (INDEPENDENT_AMBULATORY_CARE_PROVIDER_SITE_OTHER): Payer: BLUE CROSS/BLUE SHIELD | Admitting: Emergency Medicine

## 2015-12-08 ENCOUNTER — Ambulatory Visit (INDEPENDENT_AMBULATORY_CARE_PROVIDER_SITE_OTHER): Payer: BLUE CROSS/BLUE SHIELD

## 2015-12-08 VITALS — BP 110/84 | HR 78 | Temp 98.4°F | Resp 16 | Ht 58.75 in | Wt 141.4 lb

## 2015-12-08 DIAGNOSIS — J4521 Mild intermittent asthma with (acute) exacerbation: Secondary | ICD-10-CM

## 2015-12-08 DIAGNOSIS — J189 Pneumonia, unspecified organism: Secondary | ICD-10-CM | POA: Diagnosis not present

## 2015-12-08 DIAGNOSIS — R059 Cough, unspecified: Secondary | ICD-10-CM

## 2015-12-08 DIAGNOSIS — R05 Cough: Secondary | ICD-10-CM | POA: Diagnosis not present

## 2015-12-08 MED ORDER — IPRATROPIUM BROMIDE 0.02 % IN SOLN
0.5000 mg | Freq: Once | RESPIRATORY_TRACT | Status: AC
  Administered 2015-12-08: 0.5 mg via RESPIRATORY_TRACT

## 2015-12-08 MED ORDER — AZITHROMYCIN 250 MG PO TABS: ORAL_TABLET | ORAL | Status: DC

## 2015-12-08 MED ORDER — ALBUTEROL SULFATE (2.5 MG/3ML) 0.083% IN NEBU
2.5000 mg | INHALATION_SOLUTION | Freq: Once | RESPIRATORY_TRACT | Status: AC
  Administered 2015-12-08: 2.5 mg via RESPIRATORY_TRACT

## 2015-12-08 MED ORDER — MONTELUKAST SODIUM 10 MG PO TABS: 10.0000 mg | ORAL_TABLET | Freq: Every day | ORAL | Status: DC

## 2015-12-08 MED ORDER — PREDNISONE 10 MG PO TABS: ORAL_TABLET | ORAL | Status: DC

## 2015-12-08 MED ORDER — ALBUTEROL SULFATE HFA 108 (90 BASE) MCG/ACT IN AERS: 2.0000 | INHALATION_SPRAY | RESPIRATORY_TRACT | Status: DC | PRN

## 2015-12-08 NOTE — Addendum Note (Signed)
Addended by: Lesle Chris A on: 12/08/2015 06:29 PM   Modules accepted: Orders

## 2015-12-08 NOTE — Patient Instructions (Signed)
Asthma, Adult Asthma is a recurring condition in which the airways tighten and narrow. Asthma can make it difficult to breathe. It can cause coughing, wheezing, and shortness of breath. Asthma episodes, also called asthma attacks, range from minor to life-threatening. Asthma cannot be cured, but medicines and lifestyle changes can help control it. CAUSES Asthma is believed to be caused by inherited (genetic) and environmental factors, but its exact cause is unknown. Asthma may be triggered by allergens, lung infections, or irritants in the air. Asthma triggers are different for each person. Common triggers include:   Animal dander.  Dust mites.  Cockroaches.  Pollen from trees or grass.  Mold.  Smoke.  Air pollutants such as dust, household cleaners, hair sprays, aerosol sprays, paint fumes, strong chemicals, or strong odors.  Cold air, weather changes, and winds (which increase molds and pollens in the air).  Strong emotional expressions such as crying or laughing hard.  Stress.  Certain medicines (such as aspirin) or types of drugs (such as beta-blockers).  Sulfites in foods and drinks. Foods and drinks that may contain sulfites include dried fruit, potato chips, and sparkling grape juice.  Infections or inflammatory conditions such as the flu, a cold, or an inflammation of the nasal membranes (rhinitis).  Gastroesophageal reflux disease (GERD).  Exercise or strenuous activity. SYMPTOMS Symptoms may occur immediately after asthma is triggered or many hours later. Symptoms include:  Wheezing.  Excessive nighttime or early morning coughing.  Frequent or severe coughing with a common cold.  Chest tightness.  Shortness of breath. DIAGNOSIS  The diagnosis of asthma is made by a review of your medical history and a physical exam. Tests may also be performed. These may include:  Lung function studies. These tests show how much air you breathe in and out.  Allergy  tests.  Imaging tests such as X-rays. TREATMENT  Asthma cannot be cured, but it can usually be controlled. Treatment involves identifying and avoiding your asthma triggers. It also involves medicines. There are 2 classes of medicine used for asthma treatment:   Controller medicines. These prevent asthma symptoms from occurring. They are usually taken every day.  Reliever or rescue medicines. These quickly relieve asthma symptoms. They are used as needed and provide short-term relief. Your health care provider will help you create an asthma action plan. An asthma action plan is a written plan for managing and treating your asthma attacks. It includes a list of your asthma triggers and how they may be avoided. It also includes information on when medicines should be taken and when their dosage should be changed. An action plan may also involve the use of a device called a peak flow meter. A peak flow meter measures how well the lungs are working. It helps you monitor your condition. HOME CARE INSTRUCTIONS   Take medicines only as directed by your health care provider. Speak with your health care provider if you have questions about how or when to take the medicines.  Use a peak flow meter as directed by your health care provider. Record and keep track of readings.  Understand and use the action plan to help minimize or stop an asthma attack without needing to seek medical care.  Control your home environment in the following ways to help prevent asthma attacks:  Do not smoke. Avoid being exposed to secondhand smoke.  Change your heating and air conditioning filter regularly.  Limit your use of fireplaces and wood stoves.  Get rid of pests (such as roaches   and mice) and their droppings.  Throw away plants if you see mold on them.  Clean your floors and dust regularly. Use unscented cleaning products.  Try to have someone else vacuum for you regularly. Stay out of rooms while they are  being vacuumed and for a short while afterward. If you vacuum, use a dust mask from a hardware store, a double-layered or microfilter vacuum cleaner bag, or a vacuum cleaner with a HEPA filter.  Replace carpet with wood, tile, or vinyl flooring. Carpet can trap dander and dust.  Use allergy-proof pillows, mattress covers, and box spring covers.  Wash bed sheets and blankets every week in hot water and dry them in a dryer.  Use blankets that are made of polyester or cotton.  Clean bathrooms and kitchens with bleach. If possible, have someone repaint the walls in these rooms with mold-resistant paint. Keep out of the rooms that are being cleaned and painted.  Wash hands frequently. SEEK MEDICAL CARE IF:   You have wheezing, shortness of breath, or a cough even if taking medicine to prevent attacks.  The colored mucus you cough up (sputum) is thicker than usual.  Your sputum changes from clear or white to yellow, green, gray, or bloody.  You have any problems that may be related to the medicines you are taking (such as a rash, itching, swelling, or trouble breathing).  You are using a reliever medicine more than 2-3 times per week.  Your peak flow is still at 50-79% of your personal best after following your action plan for 1 hour.  You have a fever. SEEK IMMEDIATE MEDICAL CARE IF:   You seem to be getting worse and are unresponsive to treatment during an asthma attack.  You are short of breath even at rest.  You get short of breath when doing very little physical activity.  You have difficulty eating, drinking, or talking due to asthma symptoms.  You develop chest pain.  You develop a fast heartbeat.  You have a bluish color to your lips or fingernails.  You are light-headed, dizzy, or faint.  Your peak flow is less than 50% of your personal best.   This information is not intended to replace advice given to you by your health care provider. Make sure you discuss any  questions you have with your health care provider.   Document Released: 10/29/2005 Document Revised: 07/20/2015 Document Reviewed: 05/28/2013 Elsevier Interactive Patient Education 2016 Elsevier Inc.  

## 2015-12-08 NOTE — Progress Notes (Addendum)
Patient ID: Mark Cole, male   DOB: 1966-01-30, 50 y.o.   MRN: [id number]     By signing my name below, I, Mark Cole, attest that this documentation has been prepared under the direction and in the presence of Mark Chris, MD.  Electronically Signed: Littie Cole, Medical Scribe. 12/08/2015. 8:49 AM.   Chief Complaint:  Chief Complaint  Patient presents with  . Cough    x 3 wks, nonproductive  . Medication Refill    proair  . Flu Vaccine    HPI: Mark Cole is a 50 y.o. male with a history of asthma who reports to [facility] today complaining of gradual onset, dry cough that started 3-4 weeks ago. Patient reports having mild wheeze. He states he quit smoking a long time ago. He is requesting a refill for Proair as he has run out.  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  No past medical history on file. No past surgical history on file. Social History   Social History  . Marital Status: Married    Spouse Name: N/A  . Number of Children: N/A  . Years of Education: N/A   Social History Main Topics  . Smoking status: Former Smoker    Types: Cigarettes    Quit date: 12/07/2010  . Smokeless tobacco: None  . Alcohol Use: No  . Drug Use: No  . Sexual Activity: Not Asked   Other Topics Concern  . None   Social History Narrative   No family history on file. No Known Allergies Prior to Admission medications   Medication Sig Start Date End Date Taking? Authorizing Provider  albuterol (PROAIR HFA) 108 (90 Base) MCG/ACT inhaler Inhale 2 puffs into the lungs every 4 (four) hours as needed for wheezing or shortness of breath.   Yes Historical Provider, MD  albuterol (PROVENTIL HFA;VENTOLIN HFA) 108 (90 BASE) MCG/ACT inhaler Inhale 2 puffs into the lungs every 4 (four) hours as needed for wheezing (cough, shortness of breath or wheezing.). 09/06/13 11/03/14  Mark Dane, MD     ROS: The patient denies fevers, chills, night sweats, unintentional weight loss, chest  pain, palpitations, wheezing, dyspnea on exertion, nausea, vomiting, abdominal pain, dysuria, hematuria, melena, numbness, weakness, or tingling.  All other systems have been reviewed and were otherwise negative with the exception of those mentioned in the HPI and as above.  PHYSICAL EXAM: Filed Vitals:   12/08/15 0829  BP: 140/94  Pulse: 78  Temp: 98.4 F (36.9 C)  Resp: 16   Body mass index is 28.81 kg/(m^2).   General: Alert, no acute distress HEENT:  Normocephalic, atraumatic, oropharynx patent. Eye: Nonie Hoyer [facility] Cardiovascular:  Regular rate and rhythm, no rubs murmurs or gallops.  No Carotid bruits, radial pulse intact. No pedal edema. Repeat blood pressure: 124/84. Respiratory: Decreased breath sounds in the bases. Prolonged expiration. No wheezes. Abdominal: No organomegaly, abdomen is soft and non-tender, positive bowel sounds.  No masses. Musculoskeletal: Gait intact. No edema, tenderness Skin: No rashes. Neurologic: Facial musculature symmetric. Psychiatric: Patient acts appropriately throughout our interaction. Lymphatic: No cervical or submandibular lymphadenopathy History   LABS:    EKG/XRAY:   Primary read interpreted by Dr. Cleta Alberts at [facility]. No acute disease.Radiologist feels there is a Left perihilar infiltrate.   ASSESSMENT/PLAN: Patient placed on a short taper of prednisone. Was also given Singulair to take at night. He was given a rescue pro air inhaler.I personally performed the services described in this documentation, which was scribed in my presence. The recorded  information has been reviewed and is accurate.When I saw the Xray report I sent in a Zpak and notified the patient.    Gross sideeffects, risk and benefits, and alternatives of medications d/w patient. Patient is aware that all medications have potential sideeffects and we are unable to predict every sideeffect or drug-drug interaction that may occur.  Mark Chris MD 12/08/2015 8:49  AM

## 2015-12-11 NOTE — Progress Notes (Signed)
Quick Note:  Result given to wife who said PT was feeling better ______

## 2016-03-06 ENCOUNTER — Ambulatory Visit (INDEPENDENT_AMBULATORY_CARE_PROVIDER_SITE_OTHER): Payer: BLUE CROSS/BLUE SHIELD | Admitting: Family Medicine

## 2016-03-06 VITALS — BP 136/80 | HR 83 | Temp 98.1°F | Resp 17 | Ht 59.5 in | Wt 146.0 lb

## 2016-03-06 DIAGNOSIS — J4531 Mild persistent asthma with (acute) exacerbation: Secondary | ICD-10-CM | POA: Diagnosis not present

## 2016-03-06 MED ORDER — PREDNISONE 20 MG PO TABS: ORAL_TABLET | ORAL | Status: DC

## 2016-03-06 NOTE — Patient Instructions (Addendum)
Asthma Attack Prevention While you may not be able to control the fact that you have asthma, you can take actions to prevent asthma attacks. The best way to prevent asthma attacks is to maintain good control of your asthma. You can achieve this by:  Taking your medicines as directed.  Avoiding things that can irritate your airways or make your asthma symptoms worse (asthma triggers).  Keeping track of how well your asthma is controlled and of any changes in your symptoms.  Responding quickly to worsening asthma symptoms (asthma attack).  Seeking emergency care when it is needed. WHAT ARE SOME WAYS TO PREVENT AN ASTHMA ATTACK? Have a Plan Work with your health care provider to create a written plan for managing and treating your asthma attacks (asthma action plan). This plan includes:  A list of your asthma triggers and how you can avoid them.  Information on when medicines should be taken and when their dosages should be changed.  The use of a device that measures how well your lungs are working (peak flow meter). Monitor Your Asthma Use your peak flow meter and record your results in a journal every day. A drop in your peak flow numbers on one or more days may indicate the start of an asthma attack. This can happen even before you start to feel symptoms. You can prevent an asthma attack from getting worse by following the steps in your asthma action plan. Avoid Asthma Triggers Work with your asthma health care provider to find out what your asthma triggers are. This can be done by:  Allergy testing.  Keeping a journal that notes when asthma attacks occur and the factors that may have contributed to them.  Determining if there are other medical conditions that are making your asthma worse. Once you have determined your asthma triggers, take steps to avoid them. This may include avoiding excessive or prolonged exposure to:  Dust. Have someone dust and vacuum your home for you once or  twice a week. Using a high-efficiency particulate arrestance (HEPA) vacuum is best.  Smoke. This includes campfire smoke, forest fire smoke, and secondhand smoke from tobacco products.  Pet dander. Avoid contact with animals that you know you are allergic to.  Allergens from trees, grasses or pollens. Avoid spending a lot of time outdoors when pollen counts are high, and on very windy days.  Very cold, dry, or humid air.  Mold.  Foods that contain high amounts of sulfites.  Strong odors.  Outdoor air pollutants, such as engine exhaust.  Indoor air pollutants, such as aerosol sprays and fumes from household cleaners.  Household pests, including dust mites and cockroaches, and pest droppings.  Certain medicines, including NSAIDs. Always talk to your health care provider before stopping or starting any new medicines. Medicines Take over-the-counter and prescription medicines only as told by your health care provider. Many asthma attacks can be prevented by carefully following your medicine schedule. Taking your medicines correctly is especially important when you cannot avoid certain asthma triggers. Act Quickly If an asthma attack does happen, acting quickly can decrease how severe it is and how long it lasts. Take these steps:   Pay attention to your symptoms. If you are coughing, wheezing, or having difficulty breathing, do not wait to see if your symptoms go away on their own. Follow your asthma action plan.  If you have followed your asthma action plan and your symptoms are not improving, call your health care provider or seek immediate medical care   at the nearest hospital. It is important to note how often you need to use your fast-acting rescue inhaler. If you are using your rescue inhaler more often, it may mean that your asthma is not under control. Adjusting your asthma treatment plan may help you to prevent future asthma attacks and help you to gain better control of your  condition. HOW CAN I PREVENT AN ASTHMA ATTACK WHEN I EXERCISE? Follow advice from your health care provider about whether you should use your fast-acting inhaler before exercising. Many people with asthma experience exercise-induced bronchoconstriction (EIB). This condition often worsens during vigorous exercise in cold, humid, or dry environments. Usually, people with EIB can stay very active by pre-treating with a fast-acting inhaler before exercising.   This information is not intended to replace advice given to you by your health care provider. Make sure you discuss any questions you have with your health care provider.   Document Released: 10/17/2009 Document Revised: 07/20/2015 Document Reviewed: 03/31/2015 Elsevier Interactive Patient Education 2016 Elsevier Inc.  

## 2016-03-06 NOTE — Progress Notes (Signed)
  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 He has a history of seasonal allergies and has been wheezing for the better part of a month now. He was originally given Proventil which helps and also montelukast which doesn't help.  Been having worse symptoms at night with dry cough and chest burning.  Objective:BP 136/80 mmHg  Pulse 83  Temp(Src) 98.1 F (36.7 C) (Oral)  Resp 17  Ht 4' 11.5" (1.511 m)  Wt 146 lb (66.225 kg)  BMI 29.01 kg/m2  SpO2 96% Oropharynx: Clear Chest: Decreased breath sounds, few faint wheezes Heart: Regular no murmur Extremity: No edema Skin: No rash  Asthma with acute exacerbation, mild persistent - Plan: predniSONE (DELTASONE) 20 MG tablet  Elvina SidleKurt Chelsea Pedretti M.D.

## 2016-05-05 ENCOUNTER — Ambulatory Visit (INDEPENDENT_AMBULATORY_CARE_PROVIDER_SITE_OTHER): Payer: BLUE CROSS/BLUE SHIELD | Admitting: Family Medicine

## 2016-05-05 ENCOUNTER — Ambulatory Visit (INDEPENDENT_AMBULATORY_CARE_PROVIDER_SITE_OTHER): Payer: BLUE CROSS/BLUE SHIELD

## 2016-05-05 VITALS — BP 124/72 | HR 73 | Temp 97.7°F | Resp 12 | Ht 59.0 in | Wt 144.0 lb

## 2016-05-05 DIAGNOSIS — J4531 Mild persistent asthma with (acute) exacerbation: Secondary | ICD-10-CM | POA: Diagnosis not present

## 2016-05-05 DIAGNOSIS — J9801 Acute bronchospasm: Secondary | ICD-10-CM

## 2016-05-05 DIAGNOSIS — R0602 Shortness of breath: Secondary | ICD-10-CM

## 2016-05-05 LAB — COMPREHENSIVE METABOLIC PANEL
ALT: 17 U/L (ref 9–46)
AST: 18 U/L (ref 10–40)
Albumin: 4.1 g/dL (ref 3.6–5.1)
Alkaline Phosphatase: 50 U/L (ref 40–115)
BUN: 18 mg/dL (ref 7–25)
CHLORIDE: 106 mmol/L (ref 98–110)
CO2: 25 mmol/L (ref 20–31)
CREATININE: 0.74 mg/dL (ref 0.60–1.35)
Calcium: 9 mg/dL (ref 8.6–10.3)
GLUCOSE: 104 mg/dL — AB (ref 65–99)
Potassium: 4.3 mmol/L (ref 3.5–5.3)
SODIUM: 137 mmol/L (ref 135–146)
TOTAL PROTEIN: 6.9 g/dL (ref 6.1–8.1)
Total Bilirubin: 0.4 mg/dL (ref 0.2–1.2)

## 2016-05-05 LAB — POCT CBC
Granulocyte percent: 53 %G (ref 37–80)
HCT, POC: 44.6 % (ref 43.5–53.7)
HEMOGLOBIN: 14.9 g/dL (ref 14.1–18.1)
LYMPH, POC: 2.5 (ref 0.6–3.4)
MCH, POC: 24.4 pg — AB (ref 27–31.2)
MCHC: 33.4 g/dL (ref 31.8–35.4)
MCV: 73 fL — AB (ref 80–97)
MID (cbc): 0.7 (ref 0–0.9)
MPV: 6.9 fL (ref 0–99.8)
POC GRANULOCYTE: 3.7 (ref 2–6.9)
POC LYMPH PERCENT: 36.4 %L (ref 10–50)
POC MID %: 10.6 %M (ref 0–12)
Platelet Count, POC: 297 10*3/uL (ref 142–424)
RBC: 6.11 M/uL (ref 4.69–6.13)
RDW, POC: 14 %
WBC: 7 10*3/uL (ref 4.6–10.2)

## 2016-05-05 MED ORDER — PREDNISONE 20 MG PO TABS: ORAL_TABLET | ORAL | Status: DC

## 2016-05-05 MED ORDER — BUDESONIDE-FORMOTEROL FUMARATE 160-4.5 MCG/ACT IN AERO: 2.0000 | INHALATION_SPRAY | Freq: Two times a day (BID) | RESPIRATORY_TRACT | Status: DC

## 2016-05-05 MED ORDER — ALBUTEROL SULFATE (2.5 MG/3ML) 0.083% IN NEBU
2.5000 mg | INHALATION_SOLUTION | Freq: Once | RESPIRATORY_TRACT | Status: AC
  Administered 2016-05-05: 2.5 mg via RESPIRATORY_TRACT

## 2016-05-05 MED ORDER — IPRATROPIUM BROMIDE 0.02 % IN SOLN
0.5000 mg | Freq: Once | RESPIRATORY_TRACT | Status: AC
  Administered 2016-05-05: 0.5 mg via RESPIRATORY_TRACT

## 2016-05-05 NOTE — Progress Notes (Signed)
Subjective:    Patient ID: Mark Cole, male    DOB: Jan 15, 1966, 50 y.o.   MRN: [id number]  05/05/2016  Shortness of Breath   HPI This 50 y.o. male presents for evaluation of shortness of breath.   Really in mornings short of breath and wheezing.  Wheezing is worse than baseline. Using a lot of Albuterol; needing refills. Minimal improvement since last visit in 02/2016; intermittent worsening.  Last Albuterol 9:00am.    No fever/chills/sweats.  No headache.  No ear pain; no sore throat. No rhinorrhea; no nasal congestion.  Sometimes sneezing but not daily.  "I cannot cough":  +wheezing frequently +SOB daily especially in morning.  Unable to take a completely deep breath. Using Albuterol every four hours.  Used Singulair and Prednisone but is gone.  Did not get refill of Singulair.  No tobacco; smoked from age 50 and quit at age 50 years old.  [place].  Works as Patent attorneywashing uniforms. No chemical exposure.    First visit in [facility] in 12/2011 with bronchitis, wheezing/bronchospasm.  Review of Systems  Constitutional: Negative for fever, chills, diaphoresis, activity change, appetite change and fatigue.  HENT: Negative for congestion, ear pain, postnasal drip, rhinorrhea, sore throat, trouble swallowing and voice change.   Respiratory: Positive for shortness of breath and wheezing. Negative for cough and stridor.   Cardiovascular: Negative for chest pain, palpitations and leg swelling.  Gastrointestinal: Negative for nausea, vomiting, abdominal pain and diarrhea.  Endocrine: Negative for cold intolerance, heat intolerance, polydipsia, polyphagia and polyuria.  Skin: Negative for color change, rash and wound.  Neurological: Negative for dizziness, tremors, seizures, syncope, facial asymmetry, speech difficulty, weakness, light-headedness, numbness and headaches.  Psychiatric/Behavioral: Negative for sleep disturbance and dysphoric mood. The patient is not nervous/anxious.     Past Medical  History  Diagnosis Date  . Asthma    History reviewed. No pertinent past surgical history. No Known Allergies  Social History   Social History  . Marital Status: Married    Spouse Name: N/A  . Number of Children: 3  . Years of Education: N/A   Occupational History  . assembly work     washes clothing   Social History Main Topics  . Smoking status: Former Smoker    Types: Cigarettes    Quit date: 12/07/2010  . Smokeless tobacco: Not on file  . Alcohol Use: No  . Drug Use: No  . Sexual Activity: Not on file   Other Topics Concern  . Not on file   Social History Narrative   Marital status: married; from [place]; moved to [place] in 1992      Children: 3 children      Employment: Dispensing opticiancleaning clothing      Tobacco: quit smoking age 50; started smoking age 50      Alcohol: none   History reviewed. No pertinent family history.     Objective:    BP 124/72 mmHg  Pulse 73  Temp(Src) 97.7 F (36.5 C) (Oral)  Resp 12  Ht 4\' 11"  (1.499 m)  Wt 144 lb (65.318 kg)  BMI 29.07 kg/m2  SpO2 95% Physical Exam  Constitutional: He is oriented to person, place, and time. He appears well-developed and well-nourished. No distress.  HENT:  Head: Normocephalic and atraumatic.  Right Ear: External ear normal.  Left Ear: External ear normal.  Nose: Nose normal.  Mouth/Throat: Oropharynx is clear and moist.  Eyes: Conjunctivae and EOM are normal. Pupils are equal, round, and reactive to  light.  Neck: Normal range of motion. Neck supple. Carotid bruit is not present. No thyromegaly present.  Cardiovascular: Normal rate, regular rhythm, normal heart sounds and intact distal pulses.  Exam reveals no gallop and no friction rub.   No murmur heard. Pulmonary/Chest: Effort normal. No accessory muscle usage. No tachypnea. No respiratory distress. He has no decreased breath sounds. He has wheezes. He has no rhonchi. He has no rales.  Decreased air movement throughout; diffuse inspiratory and  expiratory wheezing throughout; speaking complete sentences.   Abdominal: Soft. Bowel sounds are normal. He exhibits no distension and no mass. There is no tenderness. There is no rebound and no guarding.  Lymphadenopathy:    He has no cervical adenopathy.  Neurological: He is alert and oriented to person, place, and time. No cranial nerve deficit.  Skin: Skin is warm and dry. No rash noted. He is not diaphoretic.  Psychiatric: He has a normal mood and affect. His behavior is normal.  Nursing note and vitals reviewed.   No results found.    ALBUTEROL/ATROVENT NEBULIZER ADMINISTERED. IMPROVED AIR FLOW WITH MINIMAL WHEEZING AFTER NEBULIZER. PEAK FLOW: 350, 350, 350 POST-NEBULIZER  EKG: SINUS BRADYCARDIA; NO ACUTE PROCESS.   Assessment & Plan:   1. Shortness of breath   2. Bronchospasm   3. Asthma with acute exacerbation, mild persistent     Orders Placed This Encounter  Procedures  . DG Chest 2 View    Standing Status: Future     Number of Occurrences: 1     Standing Expiration Date: 05/05/2017    Order Specific Question:  Reason for Exam (SYMPTOM  OR DIAGNOSIS REQUIRED)    Answer:  wheezing, SOB    Order Specific Question:  Preferred imaging location?    Answer:  External  . Comprehensive metabolic panel  . POCT CBC  . EKG 12-Lead  . Spirometry with graph    Standing Status: Future     Number of Occurrences:      Standing Expiration Date: 05/05/2017    Order Specific Question:  Where should this test be performed?    Answer:  Other    Order Specific Question:  Basic spirometry    Answer:  Yes    Order Specific Question:  Spirometry pre & post bronchodilator    Answer:  No   Meds ordered this encounter  Medications  . albuterol (PROVENTIL) (2.5 MG/3ML) 0.083% nebulizer solution 2.5 mg    Sig:   . ipratropium (ATROVENT) nebulizer solution 0.5 mg    Sig:   . budesonide-formoterol (SYMBICORT) 160-4.5 MCG/ACT inhaler    Sig: Inhale 2 puffs into the lungs 2 (two) times  daily.    Dispense:  1 Inhaler    Refill:  11  . predniSONE (DELTASONE) 20 MG tablet    Sig: Two tablets daily x 5 days then one tablet daily x 5 days    Dispense:  15 tablet    Refill:  0    Return in about 4 weeks (around 06/02/2016) for recheck.    Arva Slaugh Paulita FujitaMartin Maxene Byington, M.D. [facility]  [facility] [address] [place], [place]  [id number] [phone] phone [phone] fax

## 2016-05-05 NOTE — Patient Instructions (Addendum)
   IF you received an x-ray today, you will receive an invoice from [facility]. Please contact  Radiology at [phone] with questions or concerns regarding your invoice.   IF you received labwork today, you will receive an invoice from [facility]. Please contact [facility] at [phone] with questions or concerns regarding your invoice.   Our billing staff will not be able to assist you with questions regarding bills from these companies.  You will be contacted with the lab results as soon as they are available. The fastest way to get your results is to activate your My Chart account. Instructions are located on the last page of this paperwork. If you have not heard from us regarding the results in 2 weeks, please contact this office.     Asthma Attack Prevention While you may not be able to control the fact that you have asthma, you can take actions to prevent asthma attacks. The best way to prevent asthma attacks is to maintain good control of your asthma. You can achieve this by:  Taking your medicines as directed.  Avoiding things that can irritate your airways or make your asthma symptoms worse (asthma triggers).  Keeping track of how well your asthma is controlled and of any changes in your symptoms.  Responding quickly to worsening asthma symptoms (asthma attack).  Seeking emergency care when it is needed. WHAT ARE SOME WAYS TO PREVENT AN ASTHMA ATTACK? Have a Plan Work with your health care provider to create a written plan for managing and treating your asthma attacks (asthma action plan). This plan includes:  A list of your asthma triggers and how you can avoid them.  Information on when medicines should be taken and when their dosages should be changed.  The use of a device that measures how well your lungs are working (peak flow meter). Monitor Your Asthma Use your peak flow meter and record your results in a journal  every day. A drop in your peak flow numbers on one or more days may indicate the start of an asthma attack. This can happen even before you start to feel symptoms. You can prevent an asthma attack from getting worse by following the steps in your asthma action plan. Avoid Asthma Triggers Work with your asthma health care provider to find out what your asthma triggers are. This can be done by:  Allergy testing.  Keeping a journal that notes when asthma attacks occur and the factors that may have contributed to them.  Determining if there are other medical conditions that are making your asthma worse. Once you have determined your asthma triggers, take steps to avoid them. This may include avoiding excessive or prolonged exposure to:  Dust. Have someone dust and vacuum your home for you once or twice a week. Using a high-efficiency particulate arrestance (HEPA) vacuum is best.  Smoke. This includes campfire smoke, forest fire smoke, and secondhand smoke from tobacco products.  Pet dander. Avoid contact with animals that you know you are allergic to.  Allergens from trees, grasses or pollens. Avoid spending a lot of time outdoors when pollen counts are high, and on very windy days.  Very cold, dry, or humid air.  Mold.  Foods that contain high amounts of sulfites.  Strong odors.  Outdoor air pollutants, such as engine exhaust.  Indoor air pollutants, such as aerosol sprays and fumes from household cleaners.  Household pests, including dust mites and cockroaches, and pest droppings.  Certain medicines, including   NSAIDs. Always talk to your health care provider before stopping or starting any new medicines. Medicines Take over-the-counter and prescription medicines only as told by your health care provider. Many asthma attacks can be prevented by carefully following your medicine schedule. Taking your medicines correctly is especially important when you cannot avoid certain asthma  triggers. Act Quickly If an asthma attack does happen, acting quickly can decrease how severe it is and how long it lasts. Take these steps:   Pay attention to your symptoms. If you are coughing, wheezing, or having difficulty breathing, do not wait to see if your symptoms go away on their own. Follow your asthma action plan.  If you have followed your asthma action plan and your symptoms are not improving, call your health care provider or seek immediate medical care at the nearest hospital. It is important to note how often you need to use your fast-acting rescue inhaler. If you are using your rescue inhaler more often, it may mean that your asthma is not under control. Adjusting your asthma treatment plan may help you to prevent future asthma attacks and help you to gain better control of your condition. HOW CAN I PREVENT AN ASTHMA ATTACK WHEN I EXERCISE? Follow advice from your health care provider about whether you should use your fast-acting inhaler before exercising. Many people with asthma experience exercise-induced bronchoconstriction (EIB). This condition often worsens during vigorous exercise in cold, humid, or dry environments. Usually, people with EIB can stay very active by pre-treating with a fast-acting inhaler before exercising.   This information is not intended to replace advice given to you by your health care provider. Make sure you discuss any questions you have with your health care provider.   Document Released: 10/17/2009 Document Revised: 07/20/2015 Document Reviewed: 03/31/2015 Elsevier Interactive Patient Education 2016 Elsevier Inc.  

## 2016-05-28 ENCOUNTER — Encounter: Payer: Self-pay | Admitting: Family Medicine

## 2016-10-22 ENCOUNTER — Ambulatory Visit (INDEPENDENT_AMBULATORY_CARE_PROVIDER_SITE_OTHER): Payer: BLUE CROSS/BLUE SHIELD | Admitting: Family Medicine

## 2016-10-22 VITALS — BP 134/94 | HR 86 | Temp 98.5°F | Resp 17 | Ht 59.0 in | Wt 147.0 lb

## 2016-10-22 DIAGNOSIS — R059 Cough, unspecified: Secondary | ICD-10-CM

## 2016-10-22 DIAGNOSIS — R05 Cough: Secondary | ICD-10-CM | POA: Diagnosis not present

## 2016-10-22 MED ORDER — HYDROCOD POLST-CPM POLST ER 10-8 MG/5ML PO SUER: 5.0000 mL | Freq: Two times a day (BID) | ORAL | 0 refills | Status: DC | PRN

## 2016-10-22 MED ORDER — GUAIFENESIN ER 1200 MG PO TB12: 1.0000 | ORAL_TABLET | Freq: Two times a day (BID) | ORAL | 1 refills | Status: DC | PRN

## 2016-10-22 NOTE — Progress Notes (Signed)
Patient ID: Mark Cole, male    DOB: 1966-02-10, 50 y.o.   MRN: [id number]  PCP: Mark Cole,Mark Cole  Chief Complaint  Patient presents with  . Cough    Dry, since Thursday    Subjective:   HPI 50 year old male presents for evaluation of for evaluation of cough Coughing since last Thursday and last night unable to sleep last night No sore throat now initially. Improved with honey.Chest pain only with cough. No headache.  Social History   Social History  . Marital status: Married    Spouse name: N/A  . Number of children: 3  . Years of education: N/A   Occupational History  . assembly work     washes clothing   Social History Main Topics  . Smoking status: Former Smoker    Types: Cigarettes    Quit date: 12/07/2010  . Smokeless tobacco: Not on file  . Alcohol use No  . Drug use: No  . Sexual activity: Not on file   Other Topics Concern  . Not on file   Social History Narrative   Marital status: married; from [place]; moved to [place] in 1992      Children: 3 children      Employment: Dispensing opticiancleaning clothing      Tobacco: quit smoking age 89; started smoking age 50      Alcohol: none    No family history on file. Review of Systems See HPI   Patient Active Problem List   Diagnosis Date Noted  . 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 Reactive airway disease 11/14/2013     Prior to Admission medications   Medication Sig Start Date End Date Taking? Authorizing Provider  albuterol (PROAIR HFA) 108 (90 Base) MCG/ACT inhaler Inhale 2 puffs into the lungs every 4 (four) hours as needed for wheezing or shortness of breath. 12/08/15  Yes Mark GobbleSteven A Daub, Cole  budesonide-formoterol (SYMBICORT) 160-4.5 MCG/ACT inhaler Inhale 2 puffs into the lungs 2 (two) times daily. 05/05/16  Yes Mark ChickKristi M Smith, Cole  montelukast (SINGULAIR) 10 MG tablet Take 10 mg by mouth at bedtime.   Yes Historical Provider, Cole  predniSONE (DELTASONE) 20 MG tablet Two tablets daily x 5 days then one tablet  daily x 5 days 05/05/16  Yes Mark ChickKristi M Smith, Cole   No Known Allergies     Objective:  Physical Exam  Constitutional: He is oriented to person, place, and time. He appears well-developed and well-nourished.  HENT:  Head: Normocephalic and atraumatic.  Right Ear: External ear normal.  Left Ear: External ear normal.  Nose: Nose normal.  Mouth/Throat: Oropharynx is clear and moist.  Eyes: Conjunctivae and EOM are normal. Pupils are equal, round, and reactive to light.  Neck: Normal range of motion. Neck supple.  Cardiovascular: Normal rate, regular rhythm, normal heart sounds and intact distal pulses.   Pulmonary/Chest: Effort normal and breath sounds normal. He has no wheezes. He has no rales. He exhibits no tenderness.  Musculoskeletal: Normal range of motion.  Neurological: He is alert and oriented to person, place, and time.  Skin: Skin is warm and dry.  Psychiatric: He has a normal mood and affect. His behavior is normal. Judgment and thought content normal.   Vitals:   10/22/16 1413 10/22/16 1425  BP: 136/90 (!) 134/94  Pulse: 86   Resp: 17   Temp: 98.5 F (36.9 C)      Assessment & Plan:  1. Cough Plan: -Chlorpheniramine-hydrocodone (Tussionex) 5 ml every 12 hours.  Medication causes drowsiness and or sedation.  -Guaifenesin 1200 mg twice daily with water  Return for follow-up if symptoms worsen or do not improve.  Godfrey PickKimberly S. Tiburcio PeaHarris, MSN, FNP-C [facility]

## 2016-10-22 NOTE — Patient Instructions (Addendum)
Take Tussionex 5 ml every 12 hours as needed for cough.  Take Guaifenesin 1200 mg with water twice daily.     IF you received an x-ray today, you will receive an invoice from [facility]. Please contact [facility] at [phone] with questions or concerns regarding your invoice.   IF you received labwork today, you will receive an invoice from [facility]. Please contact [facility] at [phone] with questions or concerns regarding your invoice.   Our billing staff will not be able to assist you with questions regarding bills from these companies.  You will be contacted with the lab results as soon as they are available. The fastest way to get your results is to activate your My Chart account. Instructions are located on the last page of this paperwork. If you have not heard from us regarding the results in 2 weeks, please contact this office.      Cough, Adult Coughing is a reflex that clears your throat and your airways. Coughing helps to heal and protect your lungs. It is normal to cough occasionally, but a cough that happens with other symptoms or lasts a long time may be a sign of a condition that needs treatment. A cough may last only 2-3 weeks (acute), or it may last longer than 8 weeks (chronic). What are the causes? Coughing is commonly caused by:  Breathing in substances that irritate your lungs.  A viral or bacterial respiratory infection.  Allergies.  Asthma.  Postnasal drip.  Smoking.  Acid backing up from the stomach into the esophagus (gastroesophageal reflux).  Certain medicines.  Chronic lung problems, including COPD (or rarely, lung cancer).  Other medical conditions such as heart failure. Follow these instructions at home: Pay attention to any changes in your symptoms. Take these actions to help with your discomfort:  Take medicines only as told by your health care provider.  If you were prescribed an  antibiotic medicine, take it as told by your health care provider. Do not stop taking the antibiotic even if you start to feel better.  Talk with your health care provider before you take a cough suppressant medicine.  Drink enough fluid to keep your urine clear or pale yellow.  If the air is dry, use a cold steam vaporizer or humidifier in your bedroom or your home to help loosen secretions.  Avoid anything that causes you to cough at work or at home.  If your cough is worse at night, try sleeping in a semi-upright position.  Avoid cigarette smoke. If you smoke, quit smoking. If you need help quitting, ask your health care provider.  Avoid caffeine.  Avoid alcohol.  Rest as needed. Contact a health care provider if:  You have new symptoms.  You cough up pus.  Your cough does not get better after 2-3 weeks, or your cough gets worse.  You cannot control your cough with suppressant medicines and you are losing sleep.  You develop pain that is getting worse or pain that is not controlled with pain medicines.  You have a fever.  You have unexplained weight loss.  You have night sweats. Get help right away if:  You cough up blood.  You have difficulty breathing.  Your heartbeat is very fast. This information is not intended to replace advice given to you by your health care provider. Make sure you discuss any questions you have with your health care provider. Document Released: 04/27/2011 Document Revised: 04/05/2016 Document Reviewed: 01/05/2015 Elsevier Interactive  Patient Education  2017 Elsevier Inc.  

## 2016-12-09 ENCOUNTER — Other Ambulatory Visit: Payer: Self-pay | Admitting: Emergency Medicine

## 2017-05-23 ENCOUNTER — Other Ambulatory Visit: Payer: Self-pay | Admitting: Family Medicine

## 2018-04-03 ENCOUNTER — Encounter: Payer: Self-pay | Admitting: Family Medicine

## 2018-11-29 ENCOUNTER — Encounter: Payer: Self-pay | Admitting: Family Medicine

## 2018-11-29 ENCOUNTER — Ambulatory Visit (INDEPENDENT_AMBULATORY_CARE_PROVIDER_SITE_OTHER): Payer: BLUE CROSS/BLUE SHIELD | Admitting: Family Medicine

## 2018-11-29 ENCOUNTER — Other Ambulatory Visit: Payer: Self-pay

## 2018-11-29 VITALS — BP 145/95 | HR 79 | Temp 98.2°F | Resp 14 | Ht <= 58 in | Wt 144.8 lb

## 2018-11-29 DIAGNOSIS — J45909 Unspecified asthma, uncomplicated: Secondary | ICD-10-CM

## 2018-11-29 DIAGNOSIS — J45901 Unspecified asthma with (acute) exacerbation: Secondary | ICD-10-CM | POA: Diagnosis not present

## 2018-11-29 DIAGNOSIS — Z8639 Personal history of other endocrine, nutritional and metabolic disease: Secondary | ICD-10-CM | POA: Diagnosis not present

## 2018-11-29 DIAGNOSIS — J069 Acute upper respiratory infection, unspecified: Secondary | ICD-10-CM

## 2018-11-29 LAB — GLUCOSE, POCT (MANUAL RESULT ENTRY): POC GLUCOSE: 117 mg/dL — AB (ref 70–99)

## 2018-11-29 MED ORDER — BUDESONIDE-FORMOTEROL FUMARATE 160-4.5 MCG/ACT IN AERO: 2.0000 | INHALATION_SPRAY | Freq: Two times a day (BID) | RESPIRATORY_TRACT | 5 refills | Status: AC

## 2018-11-29 MED ORDER — ALBUTEROL SULFATE HFA 108 (90 BASE) MCG/ACT IN AERS: 2.0000 | INHALATION_SPRAY | RESPIRATORY_TRACT | 5 refills | Status: AC | PRN

## 2018-11-29 MED ORDER — BENZONATATE 100 MG PO CAPS: 100.0000 mg | ORAL_CAPSULE | Freq: Three times a day (TID) | ORAL | 0 refills | Status: DC | PRN

## 2018-11-29 MED ORDER — ALBUTEROL SULFATE (2.5 MG/3ML) 0.083% IN NEBU
2.5000 mg | INHALATION_SOLUTION | Freq: Once | RESPIRATORY_TRACT | Status: AC
  Administered 2018-11-29: 2.5 mg via RESPIRATORY_TRACT

## 2018-11-29 MED ORDER — PREDNISONE 20 MG PO TABS
40.0000 mg | ORAL_TABLET | Freq: Every day | ORAL | 0 refills | Status: DC
Start: 2018-11-29 — End: 2019-01-04

## 2018-11-29 NOTE — Progress Notes (Signed)
By signing my name below, I, Temidayo Atanda-Ogunleye, attest that this documentation has been prepared under the direction and in the presence of Shade FloodGreene, Heitor Steinhoff R, MD. Electronically Signed: Robbi Garteremidayo Atanda-Ogunleye, Scribe 11/29/2018 at 11:43 AM.  Subjective:  Patient ID: Mark Cole, male    DOB: October 17, 1966  Age: 53 y.o. MRN: [id number]  CC:  Chief Complaint  Patient presents with  . Shortness of Breath    hx of asthma inhaler is not helping, coughing  and tightness in chest x3 day    HPI Mark Cole is a 53 y.o. male that presents today for evaluation and management of asthma symptoms. She  has a past medical history of Asthma.   Last seen here in 2017. Previously on Simbocort (exp. 09/2018) for asthma prn, he recently has been taking it daily. Here today with cough, chest tightness and wheezing x 3 days. He was unaware that his medication ran out and had continued using it. Inhaler has been empty for an unknown amount of time. Reports subjective fever.  He says all his kids have coughs/cold symptoms but no rash.  History Patient Active Problem List   Diagnosis Date Noted  . 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 Reactive airway disease 11/14/2013   Past Medical History:  Diagnosis Date  . Asthma    No past surgical history on file. No Known Allergies Prior to Admission medications   Medication Sig Start Date End Date Taking? Authorizing Provider  albuterol (PROAIR HFA) 108 (90 Base) MCG/ACT inhaler Inhale 2 puffs into the lungs every 4 (four) hours as needed for wheezing or shortness of breath. 12/08/15   Daub, Maylon PeppersSteven A, MD  chlorpheniramine-HYDROcodone (TUSSIONEX PENNKINETIC ER) 10-8 MG/5ML SUER Take 5 mLs by mouth every 12 (twelve) hours as needed for cough. 10/22/16   Bing NeighborsHarris, Kimberly S, FNP  Guaifenesin [facility](MUCINEX MAXIMUM STRENGTH) 1200 MG TB12 Take 1 tablet (1,200 mg total) by mouth every 12 (twelve) hours as needed. 10/22/16   Bing NeighborsHarris, Kimberly S, FNP  montelukast (SINGULAIR) 10 MG  tablet Take 10 mg by mouth at bedtime.    [provider]  predniSONE (DELTASONE) 20 MG tablet Two tablets daily x 5 days then one tablet daily x 5 days 05/05/16   Ethelda ChickSmith, Kristi M, MD  SYMBICORT 160-4.5 MCG/ACT inhaler INHALE 2 PUFFS INTO THE LUNGS TWICE DAILY 05/23/17   Ethelda ChickSmith, Kristi M, MD   No family history on file. Social History   Socioeconomic History  . Marital status: Married    Spouse name: Not on file  . Number of children: 3  . Years of education: Not on file  . Highest education level: Not on file  Occupational History  . 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 Financial resource strain: Not on file  . Food insecurity:    Worry: Not on file    Inability: Not on file  . Transportation needs:    Medical: Not on file    Non-medical: Not on file  Tobacco Use  . Smoking status: Former Smoker    Types: Cigarettes    Last attempt to quit: 12/07/2010    Years since quitting: 7.9  Substance and Sexual Activity  . Alcohol use: No    Alcohol/week: 0.0 standard drinks  . Drug use: No  . Sexual activity: Not on file  Lifestyle  . Physical activity:    Days per week: Not on file    Minutes per session: Not on file  . Stress: Not  on file  Relationships  . Social connections:    Talks on phone: Not on file    Gets together: Not on file    Attends religious service: Not on file    Active member of club or organization: Not on file    Attends meetings of clubs or organizations: Not on file    Relationship status: Not on file  . Intimate partner violence:    Fear of current or ex partner: Not on file    Emotionally abused: Not on file    Physically abused: Not on file    Forced sexual activity: Not on file  Other Topics Concern  . Not on file  Social History Narrative   Marital status: married; from [place]; moved to [place] in 1992      Children: 3 children      Employment: Dispensing optician      Tobacco: quit smoking age 66;  started smoking age 83      Alcohol: none    Review of Systems See HPI Objective:  BP (!) 145/95 (BP Location: Right Arm, Patient Position: Sitting, Cuff Size: Normal)   Pulse 79   Temp 98.2 F (36.8 C) (Oral)   Resp 14   Ht 4\' 9"  (1.448 m)   Wt 144 lb 12.8 oz (65.7 kg)   SpO2 95%   PF 200 L/min   BMI 31.33 kg/m   Brief Prior Exam: Prior to Albuterol Neb - Non toxic, speaking normal but cough. Somewhat Distant breath sounds but clear. Albuterol Neb started.  Physical Exam  Constitutional: He is oriented to person, place, and time. He appears well-developed and well-nourished. No distress.  HENT:  Head: Normocephalic and atraumatic.  Mouth/Throat: Mucous membranes are normal. No oropharyngeal exudate.  Small ruptured vesicles on the top of palate. Minimal erythema.  Pulmonary/Chest: Effort normal. No respiratory distress.  After albuterol neb, Improved aeration with faint end respiratory wheeze primarily heard on left side.  Neurological: He is alert and oriented to person, place, and time.  Skin: Skin is warm and dry. No rash noted. No erythema. No pallor.  No palmar rash  Psychiatric: He has a normal mood and affect. His behavior is normal.  Vitals reviewed.  Results for orders placed or performed in visit on 11/29/18  POCT glucose (manual entry)  Result Value Ref Range   POC Glucose 117 (A) 70 - 99 mg/dl     Assessment & Plan:    Mark Cole is a 53 y.o. male Intrinsic asthma - Plan: albuterol (PROVENTIL) (2.5 MG/3ML) 0.083% nebulizer solution 2.5 mg, predniSONE (DELTASONE) 20 MG tablet, albuterol (PROAIR HFA) 108 (90 Base) MCG/ACT inhaler, budesonide-formoterol (SYMBICORT) 160-4.5 MCG/ACT inhaler  History of hyperglycemia - Plan: POCT glucose (manual entry)  Asthma with acute exacerbation, unspecified asthma severity, unspecified whether persistent - Plan: predniSONE (DELTASONE) 20 MG tablet  Acute upper respiratory infection - Plan: benzonatate (TESSALON) 100 MG  capsule  Suspected viral upper respiratory tract infection, potentially viral infection with coxsackievirus based on few lesions in oropharynx.  Secondary flare of asthma, and unsure how long he has been out of his Symbicort.  Improved aeration after albuterol neb in office.  -Prednisone 40 mg daily x3 days, potential side effects and risk discussed  -Albuterol up to every 4-6 hours as needed for cough, Tessalon Perles prescribed but start with albuterol as likely asthma because of cough.  RTC/ER precautions if persistent/frequent need.  -Restart Symbicort  -Recheck 1 month, sooner if current symptoms not improving  or worsen  Meds ordered this encounter  Medications  . albuterol (PROVENTIL) (2.5 MG/3ML) 0.083% nebulizer solution 2.5 mg  . predniSONE (DELTASONE) 20 MG tablet    Sig: Take 2 tablets (40 mg total) by mouth daily with breakfast.    Dispense:  6 tablet    Refill:  0  . albuterol (PROAIR HFA) 108 (90 Base) MCG/ACT inhaler    Sig: Inhale 2 puffs into the lungs every 4 (four) hours as needed for wheezing or shortness of breath.    Dispense:  1 Inhaler    Refill:  5  . budesonide-formoterol (SYMBICORT) 160-4.5 MCG/ACT inhaler    Sig: Inhale 2 puffs into the lungs 2 (two) times daily.    Dispense:  10.2 Inhaler    Refill:  5  . benzonatate (TESSALON) 100 MG capsule    Sig: Take 1 capsule (100 mg total) by mouth 3 (three) times daily as needed for cough.    Dispense:  20 capsule    Refill:  0   Patient Instructions    I suspect you have a virus and possible hand-foot-and-mouth disease virus due to bumps on the top of the mouth.  This may be what the children have as well.  I suspect that virus has triggered your asthma.  Cough and lungs are better after albuterol treatment today.  You can use the albuterol treatment up to every 4-6 hours as needed over the next few days, but the prednisone pills should also help with cough and wheezing.  Additionally restart Symbicort. Tessalon  perles if needed for cough, but start with albuterol.  Follow-up in 1 month to review asthma treatment, sooner if symptoms are not improving in next few days.   Return to the clinic or go to the nearest emergency room if any of your symptoms worsen or new symptoms occur.   Upper Respiratory Infection, Adult An upper respiratory infection (URI) affects the nose, throat, and upper air passages. URIs are caused by germs (viruses). The most common type of URI is often called "the common cold." Medicines cannot cure URIs, but you can do things at home to relieve your symptoms. URIs usually get better within 7-10 days. Follow these instructions at home: Activity  Rest as needed.  If you have a fever, stay home from work or school until your fever is gone, or until your doctor says you may return to work or school. ? You should stay home until you cannot spread the infection anymore (you are not contagious). ? Your doctor may have you wear a face mask so you have less risk of spreading the infection. Relieving symptoms  Gargle with a salt-water mixture 3-4 times a day or as needed. To make a salt-water mixture, completely dissolve -1 tsp of salt in 1 cup of warm water.  Use a cool-mist humidifier to add moisture to the air. This can help you breathe more easily. Eating and drinking   Drink enough fluid to keep your pee (urine) pale yellow.  Eat soups and other clear broths. General instructions   Take over-the-counter and prescription medicines only as told by your doctor. These include cold medicines, fever reducers, and cough suppressants.  Do not use any products that contain nicotine or tobacco. These include cigarettes and e-cigarettes. If you need help quitting, ask your doctor.  Avoid being where people are smoking (avoid secondhand smoke).  Make sure you get regular shots and get the flu shot every year.  Keep all follow-up visits as  told by your doctor. This is  important. How to avoid spreading infection to others   Wash your hands often with soap and water. If you do not have soap and water, use hand sanitizer.  Avoid touching your mouth, face, eyes, or nose.  Cough or sneeze into a tissue or your sleeve or elbow. Do not cough or sneeze into your hand or into the air. Contact a doctor if:  You are getting worse, not better.  You have any of these: ? A fever. ? Chills. ? Brown or red mucus in your nose. ? Yellow or brown fluid (discharge)coming from your nose. ? Pain in your face, especially when you bend forward. ? Swollen neck glands. ? Pain with swallowing. ? White areas in the back of your throat. Get help right away if:  You have shortness of breath that gets worse.  You have very bad or constant: ? Headache. ? Ear pain. ? Pain in your forehead, behind your eyes, and over your cheekbones (sinus pain). ? Chest pain.  You have long-lasting (chronic) lung disease along with any of these: ? Wheezing. ? Long-lasting cough. ? Coughing up blood. ? A change in your usual mucus.  You have a stiff neck.  You have changes in your: ? Vision. ? Hearing. ? Thinking. ? Mood. Summary  An upper respiratory infection (URI) is caused by a germ called a virus. The most common type of URI is often called "the common cold."  URIs usually get better within 7-10 days.  Take over-the-counter and prescription medicines only as told by your doctor. This information is not intended to replace advice given to you by your health care provider. Make sure you discuss any questions you have with your health care provider. Document Released: 04/16/2008 Document Revised: 06/21/2017 Document Reviewed: 06/21/2017 Elsevier Interactive Patient Education  2019 Elsevier Inc.  Hand, Foot, and Mouth Disease, Adult Hand, foot, and mouth disease is a common viral illness. It happens mainly in children who are younger than 55 years old, but adolescents and  adults may also get it. The illness often causes:  Sore throat.  Sores in the mouth.  Fever.  Rash on the hands and feet. Usually, this condition is not serious. Most people get better within 1-2 weeks. What are the causes? This condition is usually caused by a group of viruses called enteroviruses. The disease can spread from person to person (is contagious). A person is most contagious during the first week of the illness. The infection spreads through direct contact with:  Nose discharge of an infected person.  Throat discharge of an infected person.  Stool (feces) of an infected person. What are the signs or symptoms? Symptoms of this condition include:  Small sores in the mouth. These may cause pain.  A rash on the hands and feet, and sometimes on the buttocks. The rash may also occur on the arms, legs, or other areas of the body. The rash may look like small red bumps or sores and may have blisters.  Fever.  Body aches or headaches.  Irritability.  Decreased appetite. How is this diagnosed? This condition can usually be diagnosed with a physical exam in which your health care provider will look at your rash and mouth sores. Tests are usually not needed. In some cases, a stool (feces) sample or a throat swab may be taken to check for the virus or for other infections. How is this treated? In most cases, no treatment is needed. People  usually get better within 2 weeks without treatment. To help relieve pain or fever, your health care provider may recommend over-the-counter medicines such as ibuprofen or acetaminophen. To help relieve discomfort from mouth sores, your health care provider may recommend using:  Solutions that are rinsed in the mouth.  Pain-relieving gel that is applied to the sores (topical gel).  Antacid medicine. Follow these instructions at home: Managing pain and discomfort  Rinse your mouth with a salt-water mixture 3-4 times a day or as needed. To  make a salt-water mixture, completely dissolve -1 tsp of salt in 1 cup of warm water. This can help to reduce pain from the mouth sores. Your health care provider may also recommend other rinse solutions to treat mouth sores.  To relieve discomfort when you are eating: ? Try combinations of foods to see what you can tolerate. Aim for a balanced diet. ? Eat soft foods. These may be easier to swallow. ? Avoid foods and drinks that are salty, spicy, or acidic. ? Avoid alcohol. ? Try cold food and drinks, such as water, milk, milkshakes, frozen ice pops, slushies, and sherbets. Low-calorie sport drinks are good choices for staying hydrated. General instructions   Return to your normal activities as told by your health care provider. Ask your health care provider what activities are safe for you.  Take or apply over-the-counter and prescription medicines only as told by your health care provider.  Wash your hands often with soap and water. If soap and water are not available, use hand sanitizer.  Stay away from work, schools, or other group settings during the first few days of the illness, or until your fever is gone.  Keep all follow-up visits as told by your health care provider. This is important. Contact a health care provider if:  Your symptoms get worse or do not improve within 2 weeks.  You have pain that does not get better with medicine.  You feel very irritable.  You have trouble swallowing.  You develop sores or blisters on your lips or outside of your mouth.  You have a fever for more than 3 days. Get help right away if:  You develop signs of severe dehydration, such as: ? Decreased urination. This means urinating only very small amounts or urinating fewer than 3 times in a 24-hour period. ? Urine that is very dark. ? Dry mouth, tongue, or lips. ? Decreased tears or sunken eyes. ? Dry skin. ? Rapid breathing. ? Decreased activity or being very sleepy. ? Pale  skin. ? Fingertips taking longer than 2 seconds to turn pink after a gentle squeeze. ? Weight loss.  You have a severe headache.  You have a stiff neck.  You experience changes in your behavior.  You have chest pain.  You have trouble breathing. Summary  Hand, foot, and mouth disease is a common viral illness.  This disease can spread from person to person (is contagious).  The illness often causes a sore throat, sores in the mouth, fever, and a rash on the hands and feet.  Typically, no treatment is needed for this condition. People usually get better within 2 weeks without treatment.  Get help right away if you develop signs of severe dehydration. This information is not intended to replace advice given to you by your health care provider. Make sure you discuss any questions you have with your health care provider. Document Released: 12/17/2016 Document Revised: 12/17/2016 Document Reviewed: 12/17/2016 Elsevier Interactive Patient Education  2019  [employer] Inc[employer]   Asthma Attack  Acute bronchospasm caused by asthma is also referred to as an asthma attack. Bronchospasm means that the air passages become narrowed or "tight," which limits the amount of oxygen that can get into the lungs. The narrowing is caused by inflammation and tightening of the muscles in the air tubes (bronchi) in the lungs. Excessive mucus is also produced, which narrows the airways more. This can cause trouble breathing, coughing, and loud breathing (wheezing). What are the causes? Possible triggers include:  Animal dander from the skin, hair, or feathers of animals.  Dust mites contained in house dust.  Cockroaches.  Pollen from trees or grass.  Mold.  Cigarette or tobacco smoke.  Air pollutants such as dust, household cleaners, hair sprays, aerosol sprays, paint fumes, strong chemicals, or strong odors.  Cold air or weather changes. Cold air may trigger inflammation. Winds increase molds and  pollens in the air.  Strong emotions such as crying or laughing hard.  Stress.  Certain medicines, such as aspirin or beta-blockers.  Sulfites in foods and drinks, such as dried fruits and wine.  Infections or inflammatory conditions, such as a flu, a cold, pneumonia, or inflammation of the nasal membranes (rhinitis).  Gastroesophageal reflux disease (GERD). GERD is a condition in which stomach acid backs up into your esophagus, which can irritate nearby airway structures.  Exercise or activity that requires a lot of energy. What are the signs or symptoms? Symptoms of this condition include:  Wheezing. This may sound like whistling while breathing. This may be more noticeable at night.  Excessive coughing, particularly at night.  Chest tightness or pain.  Shortness of breath.  Feeling like you cannot get enough air no matter how hard you try (air hunger). How is this diagnosed? This condition may be diagnosed based on:  Your medical history.  Your symptoms.  A physical exam.  Tests to check for other causes of your symptoms or other conditions that may have triggered your asthma attack. These tests may include: ? Chest X-ray. ? Blood tests. ? Specialized tests to assess lung function, such as breathing into a device that measures how much air you inhale and exhale (spirometry). How is this treated? The goal of treatment is to open the airways in your lungs and reduce inflammation. Most asthma attacks are treated with medicines that you inhale through a hand-held inhaler (metered dose inhaler, MDI) or a device that turns liquid medicine into a mist that you inhale (nebulizer). Medicines may include:  Quick relief or rescue medicines that relax the muscles of the bronchi. These medicines include bronchodilators, such as albuterol.  Controller medicines, such as inhaled corticosteroids. These are long-acting medicines that are used for daily asthma maintenance. If you have  a moderate or severe asthma attack, you may be treated with steroid medicines by mouth or through an IV injection at the hospital. Steroid medicines reduce inflammation in your lungs. Depending on the severity of your attack, you may need oxygen therapy to help you breathe. If your asthma attack was caused by a bacterial infection, such as pneumonia, you will be given antibiotic medicines. Follow these instructions at home: Medicines  Take over-the-counter and prescription medicines only as told by your health care provider. Keep your medicines up-to-date and available.  If you are more than [redacted] weeks pregnant and you are prescribed any new medicines, tell your obstetrician about those medicines.  If you were prescribed an antibiotic medicine, take it as told by your  health care provider. Do not stop taking the antibiotic even if you start to feel better. Avoiding triggers   Keep track of things that trigger your asthma attacks or cause you to have breathing problems, and avoid exposure to these triggers.  Do not use any products that contain nicotine or tobacco, such as cigarettes and e-cigarettes. If you need help quitting, ask your health care provider.  Avoid secondhand smoke.  Avoid strong smells, such as perfumes, aerosols, and cleaning solvents.  When pollen or air pollution is bad, keep windows closed and use an air conditioner or go to places with air conditioning. Asthma action plan  Work with your health care provider to make a written plan for managing and treating your asthma attacks (asthma action plan). This plan should include: ? A list of your asthma triggers and how to avoid them. ? Information about when your medicines should be taken and when their dosage should be changed. ? Instructions about using a device called a peak flow meter to monitor your condition. A peak flow meter measures how well your lungs are working and measures how severe your asthma is at a given  time. Your "personal best" is the highest peak flow rate you can reach when you feel good and have no asthma symptoms. General instructions  Avoid excessive exercise or activity until your asthma attack resolves. Ask your health care provider what activities are safe for you and when you can return to your normal activities.  Stay up to date on all vaccinations recommended by your health care provider, such as flu and pneumonia vaccines.  Drink enough fluid to keep your urine clear or pale yellow. Staying hydrated helps keep mucus in your lungs thin so it can be coughed up easily.  If you drink caffeine, do so in moderation.  Do not use alcohol until you have recovered.  Keep all follow-up visits as told by your health care provider. This is important. Asthma requires careful medical care, and you and your health care provider can work together to reduce the likelihood of future attacks. Contact a health care provider if:  Your peak flow reading is still at 50-79% of your personal best after you have followed your action plan for 1 hour. This is in the yellow zone, which means "caution."  You need to use a reliever medicine more than 2-3 times a week.  Your medicines are causing side effects, such as: ? Rash. ? Itching. ? Swelling. ? Trouble breathing.  Your symptoms do not improve after 48 hours.  You cough up mucus (sputum) that is thicker than usual.  You have a fever.  You need to use your medicines much more frequently than normal. Get help right away if:  Your peak flow reading is less than 50% of your personal best. This is in the red zone, which means "danger."  You have severe trouble breathing.  You develop chest pain or discomfort.  Your medicines no longer seem to be helping.  You vomit.  You cannot eat or drink without vomiting.  You are coughing up yellow, green, brown, or bloody mucus.  You have a fever and your symptoms suddenly get worse.  You have  trouble swallowing.  You feel very tired, and breathing becomes tiring. Summary  Acute bronchospasm caused by asthma is also referred to as an asthma attack.  Bronchospasm is caused by narrowing or tightness in air passages, which causes shortness of breath, coughing, and loud breathing (wheezing).  Many  things can trigger an asthma attack, such as allergens, weather changes, exercise, smoke, and other fumes.  Treatment for an asthma attack may include inhaled rescue medicines for immediate relief, as well as the use of maintenance therapy.  Get help right away if you have worsening shortness of breath, chest pain, or fever, or if your home medicines are no longer helping with your symptoms. This information is not intended to replace advice given to you by your health care provider. Make sure you discuss any questions you have with your health care provider. Document Released: 02/13/2007 Document Revised: 11/30/2016 Document Reviewed: 11/30/2016 Elsevier Interactive Patient Education  [employer].     If you have lab work done today you will be contacted with your lab results within the next 2 weeks.  If you have not heard from [place] then please contact us. The fastest way to get your results is to register for My Chart.   IF you received an x-ray today, you will receive an invoice from [facility]. Please contact [facility] at [phone] with questions or concerns regarding your invoice.   IF you received labwork today, you will receive an invoice from [facility]. Please contact [facility] at [phone] with questions or concerns regarding your invoice.   Our billing staff will not be able to assist you with questions regarding bills from these companies.  You will be contacted with the lab results as soon as they are available. The fastest way to get your results is to activate your My Chart account. Instructions are located on the last page of this paperwork.  If you have not heard from [place] regarding the results in 2 weeks, please contact this office.        .I personally performed the services described in this documentation, which was scribed in my presence. The recorded information has been reviewed and considered for accuracy and completeness, addended by me as needed, and agree with information above.  Signed,   Meredith Staggers, MD [facility].  11/29/18 11:49 AM

## 2018-11-29 NOTE — Patient Instructions (Addendum)
I suspect you have a virus and possible hand-foot-and-mouth disease virus due to bumps on the top of the mouth.  This may be what the children have as well.  I suspect that virus has triggered your asthma.  Cough and lungs are better after albuterol treatment today.  You can use the albuterol treatment up to every 4-6 hours as needed over the next few days, but the prednisone pills should also help with cough and wheezing.  Additionally restart Symbicort. Tessalon perles if needed for cough, but start with albuterol.  Follow-up in 1 month to review asthma treatment, sooner if symptoms are not improving in next few days.   Return to the clinic or go to the nearest emergency room if any of your symptoms worsen or new symptoms occur.   Upper Respiratory Infection, Adult An upper respiratory infection (URI) affects the nose, throat, and upper air passages. URIs are caused by germs (viruses). The most common type of URI is often called "the common cold." Medicines cannot cure URIs, but you can do things at home to relieve your symptoms. URIs usually get better within 7-10 days. Follow these instructions at home: Activity  Rest as needed.  If you have a fever, stay home from work or school until your fever is gone, or until your doctor says you may return to work or school. ? You should stay home until you cannot spread the infection anymore (you are not contagious). ? Your doctor may have you wear a face mask so you have less risk of spreading the infection. Relieving symptoms  Gargle with a salt-water mixture 3-4 times a day or as needed. To make a salt-water mixture, completely dissolve -1 tsp of salt in 1 cup of warm water.  Use a cool-mist humidifier to add moisture to the air. This can help you breathe more easily. Eating and drinking   Drink enough fluid to keep your pee (urine) pale yellow.  Eat soups and other clear broths. General instructions   Take over-the-counter and  prescription medicines only as told by your doctor. These include cold medicines, fever reducers, and cough suppressants.  Do not use any products that contain nicotine or tobacco. These include cigarettes and e-cigarettes. If you need help quitting, ask your doctor.  Avoid being where people are smoking (avoid secondhand smoke).  Make sure you get regular shots and get the flu shot every year.  Keep all follow-up visits as told by your doctor. This is important. How to avoid spreading infection to others   Wash your hands often with soap and water. If you do not have soap and water, use hand sanitizer.  Avoid touching your mouth, face, eyes, or nose.  Cough or sneeze into a tissue or your sleeve or elbow. Do not cough or sneeze into your hand or into the air. Contact a doctor if:  You are getting worse, not better.  You have any of these: ? A fever. ? Chills. ? Brown or red mucus in your nose. ? Yellow or brown fluid (discharge)coming from your nose. ? Pain in your face, especially when you bend forward. ? Swollen neck glands. ? Pain with swallowing. ? White areas in the back of your throat. Get help right away if:  You have shortness of breath that gets worse.  You have very bad or constant: ? Headache. ? Ear pain. ? Pain in your forehead, behind your eyes, and over your cheekbones (sinus pain). ? Chest pain.  You have long-lasting (  chronic) lung disease along with any of these: ? Wheezing. ? Long-lasting cough. ? Coughing up blood. ? A change in your usual mucus.  You have a stiff neck.  You have changes in your: ? Vision. ? Hearing. ? Thinking. ? Mood. Summary  An upper respiratory infection (URI) is caused by a germ called a virus. The most common type of URI is often called "the common cold."  URIs usually get better within 7-10 days.  Take over-the-counter and prescription medicines only as told by your doctor. This information is not intended to  replace advice given to you by your health care provider. Make sure you discuss any questions you have with your health care provider. Document Released: 04/16/2008 Document Revised: 06/21/2017 Document Reviewed: 06/21/2017 Elsevier Interactive Patient Education  2019 Elsevier Inc.  Hand, Foot, and Mouth Disease, Adult Hand, foot, and mouth disease is a common viral illness. It happens mainly in children who are younger than 53 years old, but adolescents and adults may also get it. The illness often causes:  Sore throat.  Sores in the mouth.  Fever.  Rash on the hands and feet. Usually, this condition is not serious. Most people get better within 1-2 weeks. What are the causes? This condition is usually caused by a group of viruses called enteroviruses. The disease can spread from person to person (is contagious). A person is most contagious during the first week of the illness. The infection spreads through direct contact with:  Nose discharge of an infected person.  Throat discharge of an infected person.  Stool (feces) of an infected person. What are the signs or symptoms? Symptoms of this condition include:  Small sores in the mouth. These may cause pain.  A rash on the hands and feet, and sometimes on the buttocks. The rash may also occur on the arms, legs, or other areas of the body. The rash may look like small red bumps or sores and may have blisters.  Fever.  Body aches or headaches.  Irritability.  Decreased appetite. How is this diagnosed? This condition can usually be diagnosed with a physical exam in which your health care provider will look at your rash and mouth sores. Tests are usually not needed. In some cases, a stool (feces) sample or a throat swab may be taken to check for the virus or for other infections. How is this treated? In most cases, no treatment is needed. People usually get better within 2 weeks without treatment. To help relieve pain or fever,  your health care provider may recommend over-the-counter medicines such as ibuprofen or acetaminophen. To help relieve discomfort from mouth sores, your health care provider may recommend using:  Solutions that are rinsed in the mouth.  Pain-relieving gel that is applied to the sores (topical gel).  Antacid medicine. Follow these instructions at home: Managing pain and discomfort  Rinse your mouth with a salt-water mixture 3-4 times a day or as needed. To make a salt-water mixture, completely dissolve -1 tsp of salt in 1 cup of warm water. This can help to reduce pain from the mouth sores. Your health care provider may also recommend other rinse solutions to treat mouth sores.  To relieve discomfort when you are eating: ? Try combinations of foods to see what you can tolerate. Aim for a balanced diet. ? Eat soft foods. These may be easier to swallow. ? Avoid foods and drinks that are salty, spicy, or acidic. ? Avoid alcohol. ? Try cold food and  drinks, such as water, milk, milkshakes, frozen ice pops, slushies, and sherbets. Low-calorie sport drinks are good choices for staying hydrated. General instructions   Return to your normal activities as told by your health care provider. Ask your health care provider what activities are safe for you.  Take or apply over-the-counter and prescription medicines only as told by your health care provider.  Wash your hands often with soap and water. If soap and water are not available, use hand sanitizer.  Stay away from work, schools, or other group settings during the first few days of the illness, or until your fever is gone.  Keep all follow-up visits as told by your health care provider. This is important. Contact a health care provider if:  Your symptoms get worse or do not improve within 2 weeks.  You have pain that does not get better with medicine.  You feel very irritable.  You have trouble swallowing.  You develop sores or  blisters on your lips or outside of your mouth.  You have a fever for more than 3 days. Get help right away if:  You develop signs of severe dehydration, such as: ? Decreased urination. This means urinating only very small amounts or urinating fewer than 3 times in a 24-hour period. ? Urine that is very dark. ? Dry mouth, tongue, or lips. ? Decreased tears or sunken eyes. ? Dry skin. ? Rapid breathing. ? Decreased activity or being very sleepy. ? Pale skin. ? Fingertips taking longer than 2 seconds to turn pink after a gentle squeeze. ? Weight loss.  You have a severe headache.  You have a stiff neck.  You experience changes in your behavior.  You have chest pain.  You have trouble breathing. Summary  Hand, foot, and mouth disease is a common viral illness.  This disease can spread from person to person (is contagious).  The illness often causes a sore throat, sores in the mouth, fever, and a rash on the hands and feet.  Typically, no treatment is needed for this condition. People usually get better within 2 weeks without treatment.  Get help right away if you develop signs of severe dehydration. This information is not intended to replace advice given to you by your health care provider. Make sure you discuss any questions you have with your health care provider. Document Released: 12/17/2016 Document Revised: 12/17/2016 Document Reviewed: 12/17/2016 Elsevier Interactive Patient Education  2019 Elsevier Inc.   Asthma Attack  Acute bronchospasm caused by asthma is also referred to as an asthma attack. Bronchospasm means that the air passages become narrowed or "tight," which limits the amount of oxygen that can get into the lungs. The narrowing is caused by inflammation and tightening of the muscles in the air tubes (bronchi) in the lungs. Excessive mucus is also produced, which narrows the airways more. This can cause trouble breathing, coughing, and loud breathing  (wheezing). What are the causes? Possible triggers include:  Animal dander from the skin, hair, or feathers of animals.  Dust mites contained in house dust.  Cockroaches.  Pollen from trees or grass.  Mold.  Cigarette or tobacco smoke.  Air pollutants such as dust, household cleaners, hair sprays, aerosol sprays, paint fumes, strong chemicals, or strong odors.  Cold air or weather changes. Cold air may trigger inflammation. Winds increase molds and pollens in the air.  Strong emotions such as crying or laughing hard.  Stress.  Certain medicines, such as aspirin or beta-blockers.  Sulfites in  foods and drinks, such as dried fruits and wine.  Infections or inflammatory conditions, such as a flu, a cold, pneumonia, or inflammation of the nasal membranes (rhinitis).  Gastroesophageal reflux disease (GERD). GERD is a condition in which stomach acid backs up into your esophagus, which can irritate nearby airway structures.  Exercise or activity that requires a lot of energy. What are the signs or symptoms? Symptoms of this condition include:  Wheezing. This may sound like whistling while breathing. This may be more noticeable at night.  Excessive coughing, particularly at night.  Chest tightness or pain.  Shortness of breath.  Feeling like you cannot get enough air no matter how hard you try (air hunger). How is this diagnosed? This condition may be diagnosed based on:  Your medical history.  Your symptoms.  A physical exam.  Tests to check for other causes of your symptoms or other conditions that may have triggered your asthma attack. These tests may include: ? Chest X-ray. ? Blood tests. ? Specialized tests to assess lung function, such as breathing into a device that measures how much air you inhale and exhale (spirometry). How is this treated? The goal of treatment is to open the airways in your lungs and reduce inflammation. Most asthma attacks are treated  with medicines that you inhale through a hand-held inhaler (metered dose inhaler, MDI) or a device that turns liquid medicine into a mist that you inhale (nebulizer). Medicines may include:  Quick relief or rescue medicines that relax the muscles of the bronchi. These medicines include bronchodilators, such as albuterol.  Controller medicines, such as inhaled corticosteroids. These are long-acting medicines that are used for daily asthma maintenance. If you have a moderate or severe asthma attack, you may be treated with steroid medicines by mouth or through an IV injection at the hospital. Steroid medicines reduce inflammation in your lungs. Depending on the severity of your attack, you may need oxygen therapy to help you breathe. If your asthma attack was caused by a bacterial infection, such as pneumonia, you will be given antibiotic medicines. Follow these instructions at home: Medicines  Take over-the-counter and prescription medicines only as told by your health care provider. Keep your medicines up-to-date and available.  If you are more than [redacted] weeks pregnant and you are prescribed any new medicines, tell your obstetrician about those medicines.  If you were prescribed an antibiotic medicine, take it as told by your health care provider. Do not stop taking the antibiotic even if you start to feel better. Avoiding triggers   Keep track of things that trigger your asthma attacks or cause you to have breathing problems, and avoid exposure to these triggers.  Do not use any products that contain nicotine or tobacco, such as cigarettes and e-cigarettes. If you need help quitting, ask your health care provider.  Avoid secondhand smoke.  Avoid strong smells, such as perfumes, aerosols, and cleaning solvents.  When pollen or air pollution is bad, keep windows closed and use an air conditioner or go to places with air conditioning. Asthma action plan  Work with your health care provider  to make a written plan for managing and treating your asthma attacks (asthma action plan). This plan should include: ? A list of your asthma triggers and how to avoid them. ? Information about when your medicines should be taken and when their dosage should be changed. ? Instructions about using a device called a peak flow meter to monitor your condition. A peak flow  meter measures how well your lungs are working and measures how severe your asthma is at a given time. Your "personal best" is the highest peak flow rate you can reach when you feel good and have no asthma symptoms. General instructions  Avoid excessive exercise or activity until your asthma attack resolves. Ask your health care provider what activities are safe for you and when you can return to your normal activities.  Stay up to date on all vaccinations recommended by your health care provider, such as flu and pneumonia vaccines.  Drink enough fluid to keep your urine clear or pale yellow. Staying hydrated helps keep mucus in your lungs thin so it can be coughed up easily.  If you drink caffeine, do so in moderation.  Do not use alcohol until you have recovered.  Keep all follow-up visits as told by your health care provider. This is important. Asthma requires careful medical care, and you and your health care provider can work together to reduce the likelihood of future attacks. Contact a health care provider if:  Your peak flow reading is still at 50-79% of your personal best after you have followed your action plan for 1 hour. This is in the yellow zone, which means "caution."  You need to use a reliever medicine more than 2-3 times a week.  Your medicines are causing side effects, such as: ? Rash. ? Itching. ? Swelling. ? Trouble breathing.  Your symptoms do not improve after 48 hours.  You cough up mucus (sputum) that is thicker than usual.  You have a fever.  You need to use your medicines much more frequently  than normal. Get help right away if:  Your peak flow reading is less than 50% of your personal best. This is in the red zone, which means "danger."  You have severe trouble breathing.  You develop chest pain or discomfort.  Your medicines no longer seem to be helping.  You vomit.  You cannot eat or drink without vomiting.  You are coughing up yellow, green, brown, or bloody mucus.  You have a fever and your symptoms suddenly get worse.  You have trouble swallowing.  You feel very tired, and breathing becomes tiring. Summary  Acute bronchospasm caused by asthma is also referred to as an asthma attack.  Bronchospasm is caused by narrowing or tightness in air passages, which causes shortness of breath, coughing, and loud breathing (wheezing).  Many things can trigger an asthma attack, such as allergens, weather changes, exercise, smoke, and other fumes.  Treatment for an asthma attack may include inhaled rescue medicines for immediate relief, as well as the use of maintenance therapy.  Get help right away if you have worsening shortness of breath, chest pain, or fever, or if your home medicines are no longer helping with your symptoms. This information is not intended to replace advice given to you by your health care provider. Make sure you discuss any questions you have with your health care provider. Document Released: 02/13/2007 Document Revised: 11/30/2016 Document Reviewed: 11/30/2016 Elsevier Interactive Patient Education  [employer].     If you have lab work done today you will be contacted with your lab results within the next 2 weeks.  If you have not heard from [place] then please contact us. The fastest way to get your results is to register for My Chart.   IF you received an x-ray today, you will receive an invoice from [facility]. Please contact [facility] at  [phone] with questions or concerns regarding your invoice.   IF you  received labwork today, you will receive an invoice from [facility]. Please contact [facility] at [phone] with questions or concerns regarding your invoice.   Our billing staff will not be able to assist you with questions regarding bills from these companies.  You will be contacted with the lab results as soon as they are available. The fastest way to get your results is to activate your My Chart account. Instructions are located on the last page of this paperwork. If you have not heard from us regarding the results in 2 weeks, please contact this office.

## 2019-01-03 ENCOUNTER — Ambulatory Visit: Payer: BLUE CROSS/BLUE SHIELD | Admitting: Family Medicine

## 2019-01-03 DIAGNOSIS — Z79899 Other long term (current) drug therapy: Secondary | ICD-10-CM

## 2019-01-03 DIAGNOSIS — Z794 Long term (current) use of insulin: Secondary | ICD-10-CM

## 2019-01-03 DIAGNOSIS — J45909 Unspecified asthma, uncomplicated: Secondary | ICD-10-CM | POA: Diagnosis present

## 2019-01-03 DIAGNOSIS — I2510 Atherosclerotic heart disease of native coronary artery without angina pectoris: Secondary | ICD-10-CM | POA: Diagnosis present

## 2019-01-03 DIAGNOSIS — Z7952 Long term (current) use of systemic steroids: Secondary | ICD-10-CM

## 2019-01-03 DIAGNOSIS — Z87891 Personal history of nicotine dependence: Secondary | ICD-10-CM

## 2019-01-03 DIAGNOSIS — Z7951 Long term (current) use of inhaled steroids: Secondary | ICD-10-CM

## 2019-01-03 DIAGNOSIS — I2109 ST elevation (STEMI) myocardial infarction involving other coronary artery of anterior wall: Secondary | ICD-10-CM | POA: Diagnosis not present

## 2019-01-03 DIAGNOSIS — E118 Type 2 diabetes mellitus with unspecified complications: Secondary | ICD-10-CM | POA: Diagnosis present

## 2019-01-03 DIAGNOSIS — E785 Hyperlipidemia, unspecified: Secondary | ICD-10-CM | POA: Diagnosis present

## 2019-01-03 DIAGNOSIS — R079 Chest pain, unspecified: Secondary | ICD-10-CM | POA: Diagnosis not present

## 2019-01-03 DIAGNOSIS — Z23 Encounter for immunization: Secondary | ICD-10-CM

## 2019-01-04 ENCOUNTER — Inpatient Hospital Stay (HOSPITAL_COMMUNITY)
Admission: EM | Admit: 2019-01-04 | Discharge: 2019-01-06 | DRG: 247 | Disposition: A | Discharge status: Home / Self Care | Payer: BLUE CROSS/BLUE SHIELD | Attending: Interventional Cardiology | Admitting: Interventional Cardiology

## 2019-01-04 ENCOUNTER — Other Ambulatory Visit: Payer: Self-pay

## 2019-01-04 ENCOUNTER — Emergency Department (HOSPITAL_COMMUNITY): Payer: BLUE CROSS/BLUE SHIELD

## 2019-01-04 ENCOUNTER — Inpatient Hospital Stay (HOSPITAL_COMMUNITY): Payer: BLUE CROSS/BLUE SHIELD

## 2019-01-04 ENCOUNTER — Inpatient Hospital Stay (HOSPITAL_COMMUNITY)
Admission: EM | Disposition: A | Discharge status: Home / Self Care | Payer: Self-pay | Source: Home / Self Care | Attending: Internal Medicine

## 2019-01-04 ENCOUNTER — Encounter (HOSPITAL_COMMUNITY): Payer: Self-pay

## 2019-01-04 DIAGNOSIS — I214 Non-ST elevation (NSTEMI) myocardial infarction: Secondary | ICD-10-CM

## 2019-01-04 DIAGNOSIS — Z7951 Long term (current) use of inhaled steroids: Secondary | ICD-10-CM | POA: Diagnosis not present

## 2019-01-04 DIAGNOSIS — E785 Hyperlipidemia, unspecified: Secondary | ICD-10-CM

## 2019-01-04 DIAGNOSIS — J4520 Mild intermittent asthma, uncomplicated: Secondary | ICD-10-CM | POA: Diagnosis not present

## 2019-01-04 DIAGNOSIS — Z955 Presence of coronary angioplasty implant and graft: Secondary | ICD-10-CM

## 2019-01-04 DIAGNOSIS — I519 Heart disease, unspecified: Secondary | ICD-10-CM | POA: Diagnosis not present

## 2019-01-04 DIAGNOSIS — R7303 Prediabetes: Secondary | ICD-10-CM | POA: Diagnosis not present

## 2019-01-04 DIAGNOSIS — I2510 Atherosclerotic heart disease of native coronary artery without angina pectoris: Secondary | ICD-10-CM

## 2019-01-04 DIAGNOSIS — I2102 ST elevation (STEMI) myocardial infarction involving left anterior descending coronary artery: Secondary | ICD-10-CM | POA: Diagnosis not present

## 2019-01-04 DIAGNOSIS — I25110 Atherosclerotic heart disease of native coronary artery with unstable angina pectoris: Secondary | ICD-10-CM | POA: Diagnosis not present

## 2019-01-04 DIAGNOSIS — Z23 Encounter for immunization: Secondary | ICD-10-CM | POA: Diagnosis not present

## 2019-01-04 DIAGNOSIS — Z79899 Other long term (current) drug therapy: Secondary | ICD-10-CM | POA: Diagnosis not present

## 2019-01-04 DIAGNOSIS — E118 Type 2 diabetes mellitus with unspecified complications: Secondary | ICD-10-CM

## 2019-01-04 DIAGNOSIS — Z794 Long term (current) use of insulin: Secondary | ICD-10-CM | POA: Diagnosis not present

## 2019-01-04 DIAGNOSIS — R079 Chest pain, unspecified: Secondary | ICD-10-CM | POA: Diagnosis present

## 2019-01-04 DIAGNOSIS — Z7952 Long term (current) use of systemic steroids: Secondary | ICD-10-CM | POA: Diagnosis not present

## 2019-01-04 DIAGNOSIS — I213 ST elevation (STEMI) myocardial infarction of unspecified site: Secondary | ICD-10-CM | POA: Diagnosis present

## 2019-01-04 DIAGNOSIS — Z87891 Personal history of nicotine dependence: Secondary | ICD-10-CM | POA: Diagnosis not present

## 2019-01-04 DIAGNOSIS — J45909 Unspecified asthma, uncomplicated: Secondary | ICD-10-CM | POA: Diagnosis present

## 2019-01-04 DIAGNOSIS — E782 Mixed hyperlipidemia: Secondary | ICD-10-CM | POA: Diagnosis not present

## 2019-01-04 DIAGNOSIS — I2109 ST elevation (STEMI) myocardial infarction involving other coronary artery of anterior wall: Secondary | ICD-10-CM | POA: Diagnosis present

## 2019-01-04 HISTORY — DX: Hyperlipidemia, unspecified: E78.5

## 2019-01-04 HISTORY — DX: Type 2 diabetes mellitus without complications: E11.9

## 2019-01-04 HISTORY — PX: LEFT HEART CATH AND CORONARY ANGIOGRAPHY: CATH118249

## 2019-01-04 HISTORY — DX: ST elevation (STEMI) myocardial infarction of unspecified site: I21.3

## 2019-01-04 HISTORY — PX: CORONARY/GRAFT ACUTE MI REVASCULARIZATION: CATH118305

## 2019-01-04 LAB — LIPID PANEL
Cholesterol: 294 mg/dL — ABNORMAL HIGH (ref 0–200)
HDL: 54 mg/dL (ref >40)
LDL Cholesterol: 196 mg/dL — ABNORMAL HIGH (ref 0–99)
Total CHOL/HDL Ratio: 5.4 RATIO
Triglycerides: 222 mg/dL — ABNORMAL HIGH (ref <150)
VLDL: 44 mg/dL — ABNORMAL HIGH (ref 0–40)

## 2019-01-04 LAB — HEMOGLOBIN A1C
Hgb A1c MFr Bld: 6.3 % — ABNORMAL HIGH (ref 4.8–5.6)
MEAN PLASMA GLUCOSE: 134.11 mg/dL

## 2019-01-04 LAB — CBC
HCT: 45.6 % (ref 39.0–52.0)
HEMATOCRIT: 45.6 % (ref 39.0–52.0)
Hemoglobin: 14.7 g/dL (ref 13.0–17.0)
Hemoglobin: 15 g/dL (ref 13.0–17.0)
MCH: 23.7 pg — ABNORMAL LOW (ref 26.0–34.0)
MCH: 24 pg — ABNORMAL LOW (ref 26.0–34.0)
MCHC: 32.2 g/dL (ref 30.0–36.0)
MCHC: 32.9 g/dL (ref 30.0–36.0)
MCV: 73.5 fL — ABNORMAL LOW (ref 80.0–100.0)
MCV: 73 fL — ABNORMAL LOW (ref 80.0–100.0)
Platelets: 378 10*3/uL (ref 150–400)
Platelets: 397 10*3/uL (ref 150–400)
RBC: 6.2 MIL/uL — ABNORMAL HIGH (ref 4.22–5.81)
RBC: 6.25 MIL/uL — ABNORMAL HIGH (ref 4.22–5.81)
RDW: 13.7 % (ref 11.5–15.5)
RDW: 13.7 % (ref 11.5–15.5)
WBC: 9 10*3/uL (ref 4.0–10.5)
WBC: 8.8 10*3/uL (ref 4.0–10.5)
nRBC: 0 % (ref 0.0–0.2)
nRBC: 0 % (ref 0.0–0.2)

## 2019-01-04 LAB — BASIC METABOLIC PANEL
Anion gap: 9 (ref 5–15)
Anion gap: 17 — ABNORMAL HIGH (ref 5–15)
BUN: 13 mg/dL (ref 6–20)
BUN: 11 mg/dL (ref 6–20)
CO2: 22 mmol/L (ref 22–32)
CO2: 21 mmol/L — ABNORMAL LOW (ref 22–32)
Calcium: 8.8 mg/dL — ABNORMAL LOW (ref 8.9–10.3)
Calcium: 8.7 mg/dL — ABNORMAL LOW (ref 8.9–10.3)
Chloride: 104 mmol/L (ref 98–111)
Chloride: 102 mmol/L (ref 98–111)
Creatinine, Ser: 0.82 mg/dL (ref 0.61–1.24)
Creatinine, Ser: 0.78 mg/dL (ref 0.61–1.24)
GFR calc Af Amer: >60 mL/min (ref >60)
GFR calc Af Amer: >60 mL/min (ref >60)
GFR calc non Af Amer: >60 mL/min (ref >60)
GLUCOSE: 128 mg/dL — AB (ref 70–99)
Glucose, Bld: 105 mg/dL — ABNORMAL HIGH (ref 70–99)
Potassium: 4.1 mmol/L (ref 3.5–5.1)
Potassium: 3.7 mmol/L (ref 3.5–5.1)
Sodium: 135 mmol/L (ref 135–145)
Sodium: 140 mmol/L (ref 135–145)

## 2019-01-04 LAB — ECHOCARDIOGRAM COMPLETE
Height: 57 in
Weight: 2324.8 oz

## 2019-01-04 LAB — POCT ACTIVATED CLOTTING TIME
Activated Clotting Time: 197 seconds
Activated Clotting Time: 164 seconds

## 2019-01-04 LAB — HIV ANTIBODY (ROUTINE TESTING W REFLEX): HIV Screen 4th Generation wRfx: NONREACTIVE

## 2019-01-04 LAB — TROPONIN I
TROPONIN I: 1.84 ng/mL — AB (ref <0.03)
Troponin I: 0.52 ng/mL (ref <0.03)
Troponin I: 3.2 ng/mL (ref <0.03)
Troponin I: 5.73 ng/mL (ref <0.03)

## 2019-01-04 LAB — I-STAT TROPONIN, ED: Troponin i, poc: 0.39 ng/mL (ref 0.00–0.08)

## 2019-01-04 LAB — PROTIME-INR
INR: 0.93
Prothrombin Time: 12.4 seconds (ref 11.4–15.2)

## 2019-01-04 LAB — MRSA PCR SCREENING: MRSA by PCR: NEGATIVE

## 2019-01-04 LAB — HEPARIN LEVEL (UNFRACTIONATED): HEPARIN UNFRACTIONATED: 0.18 [IU]/mL — AB (ref 0.30–0.70)

## 2019-01-04 SURGERY — CORONARY/GRAFT ACUTE MI REVASCULARIZATION
Anesthesia: LOCAL

## 2019-01-04 MED ORDER — METOPROLOL TARTRATE 12.5 MG HALF TABLET
12.5000 mg | ORAL_TABLET | Freq: Two times a day (BID) | ORAL | Status: DC
  Administered 2019-01-04: 12.5 mg via ORAL
  Filled 2019-01-04: qty 1

## 2019-01-04 MED ORDER — ATROPINE SULFATE 1 MG/10ML IJ SOSY
PREFILLED_SYRINGE | INTRAMUSCULAR | Status: AC
  Filled 2019-01-04: qty 10

## 2019-01-04 MED ORDER — VERAPAMIL HCL 2.5 MG/ML IV SOLN
INTRAVENOUS | Status: DC | PRN
  Administered 2019-01-04: 10 mL via INTRA_ARTERIAL

## 2019-01-04 MED ORDER — HEPARIN (PORCINE) 25000 UT/250ML-% IV SOLN
750.0000 [IU]/h | INTRAVENOUS | Status: DC
  Administered 2019-01-04: 750 [IU]/h via INTRAVENOUS
  Filled 2019-01-04: qty 250

## 2019-01-04 MED ORDER — HEPARIN (PORCINE) IN NACL 1000-0.9 UT/500ML-% IV SOLN
INTRAVENOUS | Status: AC
  Filled 2019-01-04: qty 500

## 2019-01-04 MED ORDER — HYDRALAZINE HCL 20 MG/ML IJ SOLN: 5.0000 mg | INTRAMUSCULAR | Status: AC | PRN

## 2019-01-04 MED ORDER — FENTANYL CITRATE (PF) 100 MCG/2ML IJ SOLN
INTRAMUSCULAR | Status: AC
  Filled 2019-01-04: qty 2

## 2019-01-04 MED ORDER — HEPARIN SODIUM (PORCINE) 1000 UNIT/ML IJ SOLN
INTRAMUSCULAR | Status: AC
  Filled 2019-01-04: qty 1

## 2019-01-04 MED ORDER — SODIUM CHLORIDE 0.9% FLUSH
3.0000 mL | Freq: Two times a day (BID) | INTRAVENOUS | Status: DC
  Administered 2019-01-04 – 2019-01-06 (×5): 3 mL via INTRAVENOUS

## 2019-01-04 MED ORDER — NITROGLYCERIN IN D5W 200-5 MCG/ML-% IV SOLN
INTRAVENOUS | Status: AC
  Filled 2019-01-04: qty 250

## 2019-01-04 MED ORDER — NITROGLYCERIN 1 MG/10 ML FOR IR/CATH LAB
INTRA_ARTERIAL | Status: DC | PRN
  Administered 2019-01-04 (×2): 200 ug via INTRACORONARY

## 2019-01-04 MED ORDER — TICAGRELOR 90 MG PO TABS
90.0000 mg | ORAL_TABLET | Freq: Two times a day (BID) | ORAL | Status: DC
  Administered 2019-01-04 – 2019-01-06 (×4): 90 mg via ORAL
  Filled 2019-01-04 (×4): qty 1

## 2019-01-04 MED ORDER — ASPIRIN EC 81 MG PO TBEC
81.0000 mg | DELAYED_RELEASE_TABLET | Freq: Every day | ORAL | Status: DC
  Administered 2019-01-05 – 2019-01-06 (×2): 81 mg via ORAL
  Filled 2019-01-04 (×2): qty 1

## 2019-01-04 MED ORDER — TICAGRELOR 90 MG PO TABS
ORAL_TABLET | ORAL | Status: DC | PRN
  Administered 2019-01-04: 180 mg via ORAL

## 2019-01-04 MED ORDER — ENOXAPARIN SODIUM 40 MG/0.4ML ~~LOC~~ SOLN
40.0000 mg | SUBCUTANEOUS | Status: DC
  Administered 2019-01-05 – 2019-01-06 (×2): 40 mg via SUBCUTANEOUS
  Filled 2019-01-04 (×2): qty 0.4

## 2019-01-04 MED ORDER — LIDOCAINE HCL (PF) 1 % IJ SOLN
INTRAMUSCULAR | Status: DC | PRN
  Administered 2019-01-04: 15 mL via SUBCUTANEOUS
  Administered 2019-01-04: 5 mL via SUBCUTANEOUS

## 2019-01-04 MED ORDER — NITROGLYCERIN 0.4 MG SL SUBL
0.4000 mg | SUBLINGUAL_TABLET | SUBLINGUAL | Status: DC | PRN
  Administered 2019-01-04: 0.4 mg via SUBLINGUAL
  Filled 2019-01-04: qty 1

## 2019-01-04 MED ORDER — ASPIRIN 81 MG PO CHEW
324.0000 mg | CHEWABLE_TABLET | Freq: Once | ORAL | Status: AC
  Administered 2019-01-04 (×2): 324 mg via ORAL
  Filled 2019-01-04: qty 4

## 2019-01-04 MED ORDER — ONDANSETRON HCL 4 MG/2ML IJ SOLN: 4.0000 mg | Freq: Four times a day (QID) | INTRAMUSCULAR | Status: DC | PRN

## 2019-01-04 MED ORDER — HEPARIN SODIUM (PORCINE) 1000 UNIT/ML IJ SOLN
INTRAMUSCULAR | Status: DC | PRN
  Administered 2019-01-04 (×2): 3000 [IU] via INTRAVENOUS
  Administered 2019-01-04: 2000 [IU] via INTRAVENOUS

## 2019-01-04 MED ORDER — MIDAZOLAM HCL 2 MG/2ML IJ SOLN
INTRAMUSCULAR | Status: AC
  Filled 2019-01-04: qty 2

## 2019-01-04 MED ORDER — HEPARIN BOLUS VIA INFUSION
3000.0000 [IU] | Freq: Once | INTRAVENOUS | Status: AC
  Administered 2019-01-04: 3000 [IU] via INTRAVENOUS
  Filled 2019-01-04: qty 3000

## 2019-01-04 MED ORDER — NITROGLYCERIN 1 MG/10 ML FOR IR/CATH LAB
INTRA_ARTERIAL | Status: AC
  Filled 2019-01-04: qty 10

## 2019-01-04 MED ORDER — METOPROLOL TARTRATE 5 MG/5ML IV SOLN
5.0000 mg | INTRAVENOUS | Status: AC
  Administered 2019-01-04: 5 mg via INTRAVENOUS

## 2019-01-04 MED ORDER — MOMETASONE FURO-FORMOTEROL FUM 200-5 MCG/ACT IN AERO
2.0000 | INHALATION_SPRAY | Freq: Two times a day (BID) | RESPIRATORY_TRACT | Status: DC
  Administered 2019-01-05 – 2019-01-06 (×2): 2 via RESPIRATORY_TRACT
  Filled 2019-01-04 (×2): qty 8.8

## 2019-01-04 MED ORDER — LIDOCAINE HCL (PF) 1 % IJ SOLN
INTRAMUSCULAR | Status: AC
  Filled 2019-01-04: qty 30

## 2019-01-04 MED ORDER — METOPROLOL TARTRATE 5 MG/5ML IV SOLN
INTRAVENOUS | Status: AC
  Filled 2019-01-04: qty 5

## 2019-01-04 MED ORDER — HEPARIN (PORCINE) IN NACL 1000-0.9 UT/500ML-% IV SOLN
INTRAVENOUS | Status: AC
  Filled 2019-01-04: qty 1000

## 2019-01-04 MED ORDER — SODIUM CHLORIDE 0.9 % IV SOLN: 250.0000 mL | INTRAVENOUS | Status: DC | PRN

## 2019-01-04 MED ORDER — LABETALOL HCL 5 MG/ML IV SOLN: 10.0000 mg | INTRAVENOUS | Status: AC | PRN

## 2019-01-04 MED ORDER — ACETAMINOPHEN 325 MG PO TABS
650.0000 mg | ORAL_TABLET | ORAL | Status: DC | PRN
  Administered 2019-01-04: 650 mg via ORAL
  Filled 2019-01-04: qty 2

## 2019-01-04 MED ORDER — MIDAZOLAM HCL 2 MG/2ML IJ SOLN
INTRAMUSCULAR | Status: DC | PRN
  Administered 2019-01-04: 1 mg via INTRAVENOUS

## 2019-01-04 MED ORDER — FENTANYL CITRATE (PF) 100 MCG/2ML IJ SOLN
INTRAMUSCULAR | Status: DC | PRN
  Administered 2019-01-04: 25 ug via INTRAVENOUS

## 2019-01-04 MED ORDER — SODIUM CHLORIDE 0.9% FLUSH: 3.0000 mL | Freq: Once | INTRAVENOUS | Status: DC

## 2019-01-04 MED ORDER — METOPROLOL SUCCINATE ER 25 MG PO TB24
12.5000 mg | ORAL_TABLET | Freq: Every day | ORAL | Status: DC
  Administered 2019-01-04 – 2019-01-05 (×2): 12.5 mg via ORAL
  Filled 2019-01-04 (×2): qty 1

## 2019-01-04 MED ORDER — ALBUTEROL SULFATE (2.5 MG/3ML) 0.083% IN NEBU: 3.0000 mL | INHALATION_SOLUTION | RESPIRATORY_TRACT | Status: DC | PRN

## 2019-01-04 MED ORDER — TICAGRELOR 90 MG PO TABS
ORAL_TABLET | ORAL | Status: AC
  Filled 2019-01-04: qty 2

## 2019-01-04 MED ORDER — PNEUMOCOCCAL VAC POLYVALENT 25 MCG/0.5ML IJ INJ
0.5000 mL | INJECTION | INTRAMUSCULAR | Status: AC
  Administered 2019-01-05: 0.5 mL via INTRAMUSCULAR
  Filled 2019-01-04: qty 0.5

## 2019-01-04 MED ORDER — ASPIRIN 81 MG PO CHEW
CHEWABLE_TABLET | ORAL | Status: AC
  Administered 2019-01-04: 324 mg via ORAL
  Filled 2019-01-04: qty 4

## 2019-01-04 MED ORDER — ATORVASTATIN CALCIUM 80 MG PO TABS
80.0000 mg | ORAL_TABLET | Freq: Every day | ORAL | Status: DC
  Administered 2019-01-04 – 2019-01-05 (×3): 80 mg via ORAL
  Filled 2019-01-04 (×3): qty 1

## 2019-01-04 MED ORDER — SODIUM CHLORIDE 0.9% FLUSH: 3.0000 mL | INTRAVENOUS | Status: DC | PRN

## 2019-01-04 SURGICAL SUPPLY — 23 items
BALLN EMERGE MR 2.25X12 (BALLOONS) ×2
BALLN ~~LOC~~ EMERGE MR 3.0X12 (BALLOONS) ×2
BALLOON EMERGE MR 2.25X12 (BALLOONS) ×1 IMPLANT
BALLOON ~~LOC~~ EMERGE MR 3.0X12 (BALLOONS) ×1 IMPLANT
CATH INFINITI 5FR ANG PIGTAIL (CATHETERS) ×2 IMPLANT
CATH INFINITI JR4 5F (CATHETERS) ×2 IMPLANT
CATH LAUNCHER 6FR EBU3.5 (CATHETERS) ×2 IMPLANT
CATH VISTA GUIDE 6FR XB3 (CATHETERS) ×2 IMPLANT
CATH VISTA GUIDE 6FR XBLAD3.5 (CATHETERS) ×2 IMPLANT
DEVICE RAD COMP TR BAND LRG (VASCULAR PRODUCTS) ×2 IMPLANT
GLIDESHEATH SLEND SS 6F .021 (SHEATH) ×2 IMPLANT
GUIDEWIRE INQWIRE 1.5J.035X260 (WIRE) ×1 IMPLANT
INQWIRE 1.5J .035X260CM (WIRE) ×2
KIT ENCORE 26 ADVANTAGE (KITS) ×2 IMPLANT
KIT HEART LEFT (KITS) ×2 IMPLANT
KIT MICROPUNCTURE NIT STIFF (SHEATH) ×2 IMPLANT
PACK CARDIAC CATHETERIZATION (CUSTOM PROCEDURE TRAY) ×2 IMPLANT
SHEATH PINNACLE 6F 10CM (SHEATH) ×2 IMPLANT
STENT SYNERGY DES 2.5X16 (Permanent Stent) ×2 IMPLANT
SYR MEDRAD MARK 7 150ML (SYRINGE) ×2 IMPLANT
TRANSDUCER W/STOPCOCK (MISCELLANEOUS) ×2 IMPLANT
TUBING CIL FLEX 10 FLL-RA (TUBING) ×2 IMPLANT
WIRE RUNTHROUGH .014X180CM (WIRE) ×2 IMPLANT

## 2019-01-04 NOTE — ED Notes (Signed)
Pt transported to XR direct from triage.

## 2019-01-04 NOTE — ED Notes (Signed)
RN Koleen Nimrod informed pt of troponin results .39. He will I informed Dr Elesa Massed

## 2019-01-04 NOTE — ED Notes (Signed)
Date and time results received: 01/04/19 0215 (use smartphrase ".now" to insert current time)  Test:trop Critical Value: 0.52  Name of Provider Notified: cardiologist  Orders Received? Or Actions Taken? None taken

## 2019-01-04 NOTE — Progress Notes (Signed)
ANTICOAGULATION CONSULT NOTE - Initial Consult  Pharmacy Consult for heparin Indication: chest pain/ACS  No Known Allergies  Patient Measurements:   Heparin Dosing Weight: 65 kg  Vital Signs: Temp: 97.6 F (36.4 C) (02/23 0002) Temp Source: Oral (02/23 0002) BP: 156/91 (02/23 0002) Pulse Rate: 72 (02/23 0002)  Labs: No results for input(s): HGB, HCT, PLT, APTT, LABPROT, INR, HEPARINUNFRC, HEPRLOWMOCWT, CREATININE, CKTOTAL, CKMB, TROPONINI in the last 72 hours.  CrCl cannot be calculated (Patient's most recent lab result is older than the maximum 21 days allowed.).   Medical History: Past Medical History:  Diagnosis Date  . Asthma     Medications:  See medication history  Assessment: 53 yo man to start heparin for elevated troponin, CP.  He was not on anticoagulation PTA Goal of Therapy:  Heparin level 0.3-0.7 units/ml Monitor platelets by anticoagulation protocol: Yes   Plan:  Heparin 3000 unit bolus and drip at 750 units/hr Check heparin level ~ 6 hours after start Daily HL and CBC while on heparin Monitor for bleeding complications  Thanks for allowing pharmacy to be a part of this patient's care.  Talbert Cage, PharmD Clinical Pharmacist  01/04/2019,1:17 AM

## 2019-01-04 NOTE — Progress Notes (Signed)
Right femoral arterial sheath present, site level 0 and 2+ distal pulse Arterial sheath removed at 1350 and pressure held for 20 minutes until 1410 Patient educated prior to removal of arterial sheath and second RN Lisette Abu Quick present for sheath pull. Patients vital signs stable throughout sheath pull. Site level 0 and 2+ distal pulse after sheath removed. Gauze dressing applied and patient educated on post sheath removal instructions.

## 2019-01-04 NOTE — H&P (Signed)
Cardiology Admission History and Physical:   Patient ID: Mark Cole MRN: [id number]; DOB: 11/19/1965   Admission date: 01/04/2019  Primary Care Provider: No primary care provider on file. Primary Cardiologist: None  Chief Complaint: Chest pain  Patient Profile:   Mark Cole is a 53 y.o. male with a history of asthma who is admitted with NSTEMI.   History of Present Illness:   Mark Cole is a 53 y.o. male with a history of asthma who is admitted with NSTEMI.   The patient has no cardiac history. He is [place] and speaks some [place]; his wife is at bedside and offers further interpretation. 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 This pain was a reflux-like sensation radiating to the jaw that improved with belching. He denied any dyspnea or other associated symptoms. The pain resolved spontaneously after one hour. He developed the pain again today at rest and came to the ED.   He presented to the ED today with chest pain. In the ED, ECG showed NSR with 1 mm ST elevation in V1 and V2, < 1 mm ST elevation in V3, with large and biphasic T waves in anterior precordial leads; there is ST depressions in V4, I, AVL. Initial troponin 0.39. CXR unremarkable. He was started on a heparin gtt and cardiology was consulted for admission.  On my evaluation, the patient is resting in bed. He denies any chest pain or other symptoms whatsoever at the time of my evaluation. ECG repeated and is stable from prior.    Past Medical History:  Diagnosis Date  . Asthma     History reviewed. No pertinent surgical history.   Medications Prior to Admission: Prior to Admission medications   Medication Sig Start Date End Date Taking? Authorizing Provider  albuterol (PROAIR HFA) 108 (90 Base) MCG/ACT inhaler Inhale 2 puffs into the lungs every 4 (four) hours as needed for wheezing or  shortness of breath. 11/29/18   Shade Flood, MD  benzonatate (TESSALON) 100 MG capsule Take 1 capsule (100 mg total) by mouth 3 (three) times daily as needed for cough. 11/29/18   Shade Flood, MD  budesonide-formoterol [facility]) 160-4.5 MCG/ACT inhaler Inhale 2 puffs into the lungs 2 (two) times daily. 11/29/18   Shade Flood, MD  predniSONE (DELTASONE) 20 MG tablet Take 2 tablets (40 mg total) by mouth daily with breakfast. 11/29/18   Shade Flood, MD     Allergies:   No Known Allergies  Social History:   Social History   Socioeconomic History  . Marital status: Married    Spouse name: Not on file  . Number of children: 3  . Years of education: Not on file  . Highest education level: Not on file  Occupational History  . Occupation: assembly work    Comment: Manufacturing systems engineer  Social Needs  . Financial resource strain: Not on file  . Food insecurity:    Worry: Not on file    Inability: Not on file  . Transportation needs:    Medical: Not on file    Non-medical: Not on file  Tobacco Use  . Smoking status: Former Smoker    Types: Cigarettes    Last attempt to quit: 12/07/2010    Years since quitting: 8.0  . Smokeless tobacco: Never Used  Substance and Sexual Activity  . Alcohol use: No  Alcohol/week: 0.0 standard drinks  . Drug use: No  . Sexual activity: Not on file  Lifestyle  . Physical activity:    Days per week: Not on file    Minutes per session: Not on file  . Stress: Not on file  Relationships  . Social connections:    Talks on phone: Not on file    Gets together: Not on file    Attends religious service: Not on file    Active member of club or organization: Not on file    Attends meetings of clubs or organizations: Not on file    Relationship status: Not on file  . Intimate partner violence:    Fear of current or ex partner: Not on file    Emotionally abused: Not on file    Physically abused: Not on file    Forced sexual activity: Not  on file  Other Topics Concern  . Not on file  Social History Narrative   Marital status: married; from [place]; moved to [place] in 1992      Children: 3 children      Employment: Dispensing optician      Tobacco: quit smoking age 7; started smoking age 73      Alcohol: none    Family History:  Denies family history of heart disease. States that parents lived to 100  ROS:  Please see the history of present illness.  All other ROS reviewed and negative.     Physical Exam/Data:   Vitals:   01/04/19 0002 01/04/19 0115 01/04/19 0300  BP: (!) 156/91 (!) 142/101   Pulse: 72 68   Resp: 18 17   Temp: 97.6 F (36.4 C)    TempSrc: Oral    SpO2: 97% 97%   Weight:   65.7 kg  Height:   4\' 9"  (1.448 m)   No intake or output data in the 24 hours ending 01/04/19 0306 Last 3 Weights 01/04/2019 11/29/2018 10/22/2016  Weight (lbs) 144 lb 13.5 oz 144 lb 12.8 oz 147 lb  Weight (kg) 65.7 kg 65.681 kg 66.679 kg     Body mass index is 31.34 kg/m.  General:  Well nourished, well developed, in no acute distress  HEENT: normal Neck: no apparent VD Vascular: 2+ R radial pulse  Cardiac:  normal S1, S2; RRR; no murmur   Lungs:  Few scattered crackles Abd: soft, nontender Ext: no LE edema Musculoskeletal:  No deformities, BUE and BLE strength normal and equal Skin: warm and dry  Neuro:  No focal abnormalities noted Psych:  Normal affect    EKG:  The ECG that was done and was personally reviewed and demonstrates  NSR with 1 mm ST elevation in V1 and V2, < 1 mm ST elevation in V3, with large and biphasic T waves in anterior precordial leads; there is ST depressions in V4, I, AVL.  Relevant CV Studies: None  Laboratory Data:  ChemistryNo results for input(s): NA, K, CL, CO2, GLUCOSE, BUN, CREATININE, CALCIUM, GFRNONAA, GFRAA, ANIONGAP in the last 168 hours.  No results for input(s): PROT, ALBUMIN, AST, ALT, ALKPHOS, BILITOT in the last 168 hours. HematologyNo results for input(s): WBC,  RBC, HGB, HCT, MCV, MCH, MCHC, RDW, PLT in the last 168 hours. Cardiac Enzymes Recent Labs  Lab 01/04/19 0134  TROPONINI 0.52*    Recent Labs  Lab 01/04/19 0047  TROPIPOC 0.39*    BNPNo results for input(s): BNP, PROBNP in the last 168 hours.  DDimer No results for input(s):  DDIMER in the last 168 hours.  Radiology/Studies:  Dg Chest 2 View  Result Date: 01/04/2019 CLINICAL DATA:  Chest pain EXAM: CHEST - 2 VIEW COMPARISON:  None. FINDINGS: The heart size and mediastinal contours are within normal limits. Both lungs are clear. The visualized skeletal structures are unremarkable. IMPRESSION: No active cardiopulmonary disease. Electronically Signed   By: Jasmine PangKim  Fujinaga M.D.   On: 01/04/2019 01:14    Assessment and Plan:   Chest pain NSTEMI The patient has no known cardiac history. He presents after multiple episodes of new chest pain over the past two days. ECG shows biphasic T waves in anterior precordial leads in Wellen's pattern concerning for critical LAD disease. Troponin is mildly elevated and slowly rising. His presentation is consistent with NSTEMI. He has no chest pain at this time. He will need prompt coronary angiography. -Urged patient and wife to let us know if he has any return of chest pain, as he may require overnight LHC. -Continue ASA 81 mg daily -Continue heparin gtt -Starting atorvastatin 80 mg daily -Starting metoprolol  -Echocardiogram ordered -Continue to trend troponin -HgA1c, lipid panel ordered  Asthma -Continue home inhalers   Severity of Illness: The appropriate patient status for this patient is INPATIENT. Inpatient status is judged to be reasonable and necessary in order to provide the required intensity of service to ensure the patient's safety. The patient's presenting symptoms, physical exam findings, and initial radiographic and laboratory data in the context of their chronic comorbidities is felt to place them at high risk for further clinical  deterioration. Furthermore, it is not anticipated that the patient will be medically stable for discharge from the hospital within 2 midnights of admission. The following factors support the patient status of inpatient.   " The patient's presenting symptoms include chest pain. " The worrisome physical exam findings include mild crackles. " The initial radiographic and laboratory data are worrisome because of abnormal ECG. " The chronic co-morbidities include smoking history.   * I certify that at the point of admission it is my clinical judgment that the patient will require inpatient hospital care spanning beyond 2 midnights from the point of admission due to high intensity of service, high risk for further deterioration and high frequency of surveillance required.*    For questions or updates, please contact [facility] Please consult [email or web address] for contact info under        Signed, Ernest Mallickaylor Shadi Larner, MD  01/04/2019 3:06 AM

## 2019-01-04 NOTE — Progress Notes (Signed)
  Echocardiogram 2D Echocardiogram has been performed.  Mark Cole 01/04/2019, 3:07 PM

## 2019-01-04 NOTE — Plan of Care (Signed)
  Problem: Education: Goal: Knowledge of General Education information will improve Description: Including pain rating scale, medication(s)/side effects and non-pharmacologic comfort measures Outcome: Progressing   Problem: Health Behavior/Discharge Planning: Goal: Ability to manage health-related needs will improve Outcome: Progressing   Problem: Pain Managment: Goal: General experience of comfort will improve Outcome: Progressing   

## 2019-01-04 NOTE — Progress Notes (Signed)
..  EKG CRITICAL VALUE     12 lead EKG performed.  Critical value noted. Kavin Leech, RN notified.   Abbe Amsterdam, CCT 01/04/2019 12:05 PM

## 2019-01-04 NOTE — ED Triage Notes (Signed)
Pt reports central CP with nausea and radiation to neck and jaw, started yesterday and returned worse today

## 2019-01-04 NOTE — Progress Notes (Signed)
Patient arrived to unit, wife at bedside. No complaints from patient. Will continue to monitor.

## 2019-01-04 NOTE — Progress Notes (Signed)
Patient c/o chest pain. Relays to his wife, as language is a barrier, that his chest and  shoulders are uncomfortable with "tightness radiating to neck and jaws .First described as "pain", now pt states "tight".  V/S obtained and recorded with pt being hypertensive. Stat EKG obtained revealing abnormal EKG. Kil[place], PA on unit and notified immediately.   Dr. Ladona Ridgel in, orders received for ASA 81 mg x4 tabs, Nitro 0.5 sublingual and Nitro drip initiated at 1.5 mcg.  [facility] notified.  Rapid Response at bedside along with L.Kilroy, PA and Dr. Okey Dupre.  Waiting to escort patient to the cath lab.

## 2019-01-04 NOTE — Interval H&P Note (Signed)
History and Physical Interval Note:  01/04/2019 8:46 AM  Mark Cole  has presented today for cardiac catheterization, with the diagnosis of STEMI  The various methods of treatment have been discussed with the patient and family. After consideration of risks, benefits and other options for treatment, the patient has consented to  Procedure(s): Coronary/Graft Acute MI Revascularization (N/A) LEFT HEART CATH AND CORONARY ANGIOGRAPHY (N/A) as a surgical intervention .  The patient's history has been reviewed, patient examined, no change in status, stable for surgery.  I have reviewed the patient's chart and labs.  Questions were answered to the patient's satisfaction.  He had recurrent CP around 8 AM today with repeat EKG showing marked anteroseptal ST elevation.  Patient has provided emergent verbal consent to proceed with catheterization.  Cath Lab Visit (complete for each Cath Lab visit)  Clinical Evaluation Leading to the Procedure:   ACS: Yes.    Non-ACS:  N/A  Dennys Guin

## 2019-01-04 NOTE — Progress Notes (Signed)
Dr. End at bedside. 

## 2019-01-04 NOTE — ED Provider Notes (Signed)
TIME SEEN: 1:12 AM  CHIEF COMPLAINT: Chest pain  HPI: Patient is a 53 year old male with history of asthma, previous tobacco use who quit smoking 10 years ago who presents to the emergency department with chest pain.  States he had an episode of chest pain that occurred yesterday and then another episode that happened tonight.  Describes it as a tightness that radiates into his jaw.  Pain started around 8 PM and he felt nauseated and diaphoretic.  No shortness of breath or dizziness.  Currently completely chest pain-free.  No aggravating or alleviating factors.  No history of CAD, PE, DVT, CHF.  Does not yet have a PCP.  No history of stress test or cardiac catheterization.  Does not have a cardiologist.  ROS: See HPI Constitutional: no fever  Eyes: no drainage  ENT: no runny nose   Cardiovascular:   chest pain  Resp: no SOB  GI: no vomiting GU: no dysuria Integumentary: no rash  Allergy: no hives  Musculoskeletal: no leg swelling  Neurological: no slurred speech ROS otherwise negative  PAST MEDICAL HISTORY/PAST SURGICAL HISTORY:  Past Medical History:  Diagnosis Date  . Asthma     MEDICATIONS:  Prior to Admission medications   Medication Sig Start Date End Date Taking? Authorizing Provider  albuterol (PROAIR HFA) 108 (90 Base) MCG/ACT inhaler Inhale 2 puffs into the lungs every 4 (four) hours as needed for wheezing or shortness of breath. 11/29/18   Shade Flood, MD  benzonatate (TESSALON) 100 MG capsule Take 1 capsule (100 mg total) by mouth 3 (three) times daily as needed for cough. 11/29/18   Shade Flood, MD  budesonide-formoterol [facility]) 160-4.5 MCG/ACT inhaler Inhale 2 puffs into the lungs 2 (two) times daily. 11/29/18   Shade Flood, MD  predniSONE (DELTASONE) 20 MG tablet Take 2 tablets (40 mg total) by mouth daily with breakfast. 11/29/18   Shade Flood, MD    ALLERGIES:  No Known Allergies  SOCIAL HISTORY:  Social History   Tobacco Use  .  Smoking status: Former Smoker    Types: Cigarettes    Last attempt to quit: 12/07/2010    Years since quitting: 8.0  . Smokeless tobacco: Never Used  Substance Use Topics  . Alcohol use: No    Alcohol/week: 0.0 standard drinks    FAMILY HISTORY: No family history on file.  EXAM: BP (!) 156/91 (BP Location: Right Arm)   Pulse 72   Temp 97.6 F (36.4 C) (Oral)   Resp 18   SpO2 97%  CONSTITUTIONAL: Alert and oriented and responds appropriately to questions. Well-appearing; well-nourished HEAD: Normocephalic EYES: Conjunctivae clear, pupils appear equal, EOMI ENT: normal nose; moist mucous membranes NECK: Supple, no meningismus, no nuchal rigidity, no LAD  CARD: RRR; S1 and S2 appreciated; no murmurs, no clicks, no rubs, no gallops RESP: Normal chest excursion without splinting or tachypnea; breath sounds clear and equal bilaterally; no wheezes, no rhonchi, no rales, no hypoxia or respiratory distress, speaking full sentences ABD/GI: Normal bowel sounds; non-distended; soft, non-tender, no rebound, no guarding, no peritoneal signs, no hepatosplenomegaly BACK:  The back appears normal and is non-tender to palpation, there is no CVA tenderness EXT: Normal ROM in all joints; non-tender to palpation; no edema; normal capillary refill; no cyanosis, no calf tenderness or swelling    SKIN: Normal color for age and race; warm; no rash NEURO: Moves all extremities equally PSYCH: The patient's mood and manner are appropriate. Grooming and personal hygiene are appropriate.  MEDICAL DECISION MAKING: Patient here with chest pain.  Has a very concerning story for ACS.  EKG shows abnormal ST segments in anterior leads concerning for Wellen's.  He is chest pain-free at this time.  Troponin positive at 0.39.  Discussed with cardiology fellow Dr. Allena Earing who does not feel this meets STEMI criteria.  Will give patient aspirin and heparin.  Cardiology to admit.  Will monitor patient carefully.   On  several rechecks, patient continues to be chest pain-free.   I reviewed all nursing notes, vitals, pertinent previous records, EKGs, lab and urine results, imaging (as available).      EKG Interpretation  Date/Time:  Sunday January 04 2019 00:47:49 EST Ventricular Rate:  66 PR Interval:    QRS Duration: 90 QT Interval:  426 QTC Calculation: 447 R Axis:   76 Text Interpretation:  Sinus rhythm Abnrm T, consider ischemia, anterolateral lds New changes compared to previous Reconfirmed by Ward, Baxter Hire [phone]) on 01/04/2019 1:12:47 AM        CRITICAL CARE Performed by: Baxter Hire Ward   Total critical care time: 65 minutes  Critical care time was exclusive of separately billable procedures and treating other patients.  Critical care was necessary to treat or prevent imminent or life-threatening deterioration.  Critical care was time spent personally by me on the following activities: development of treatment plan with patient and/or surrogate as well as nursing, discussions with consultants, evaluation of patient's response to treatment, examination of patient, obtaining history from patient or surrogate, ordering and performing treatments and interventions, ordering and review of laboratory studies, ordering and review of radiographic studies, pulse oximetry and re-evaluation of patient's condition.    Ward, Layla Maw, DO 01/04/19 [phone]

## 2019-01-04 NOTE — Progress Notes (Signed)
MD notified of troponin increase. 

## 2019-01-04 NOTE — Progress Notes (Signed)
Luke PA at bedside.

## 2019-01-04 NOTE — Progress Notes (Addendum)
Progress Note  Patient Name: Mark Cole Date of Encounter: 01/04/2019  Primary Cardiologist: new  Subjective   Chest pain this AM-7:55  Inpatient Medications    Scheduled Meds: . [START ON 01/05/2019] aspirin EC  81 mg Oral Daily  . atorvastatin  80 mg Oral q1800  . metoprolol tartrate      . metoprolol tartrate  12.5 mg Oral BID  . mometasone-formoterol  2 puff Inhalation BID  . [START ON 01/05/2019] pneumococcal 23 valent vaccine  0.5 mL Intramuscular Tomorrow-1000  . sodium chloride flush  3 mL Intravenous Once   Continuous Infusions: . heparin 750 Units/hr (01/04/19 0425)  . nitroGLYCERIN     PRN Meds: acetaminophen, albuterol, nitroGLYCERIN, ondansetron (ZOFRAN) IV   Vital Signs    Vitals:   01/04/19 0342 01/04/19 0755 01/04/19 0816 01/04/19 0825  BP: (!) 142/107 (!) 145/101 (!) 138/92 (!) 118/94  Pulse: 69 69 70 (!) 57  Resp: 16 20 16 16   Temp: 98.1 F (36.7 C) 97.7 F (36.5 C)    TempSrc: Oral Oral    SpO2: 95% 98%  98%  Weight:      Height:        Intake/Output Summary (Last 24 hours) at 01/04/2019 0845 Last data filed at 01/04/2019 0425 Gross per 24 hour  Intake 171.04 ml  Output -  Net 171.04 ml   Last 3 Weights 01/04/2019 01/04/2019 11/29/2018  Weight (lbs) 145 lb 4.8 oz 144 lb 13.5 oz 144 lb 12.8 oz  Weight (kg) 65.908 kg 65.7 kg 65.681 kg      Telemetry    NSR - Personally Reviewed  ECG    NSR, marked STE in V2-V4 c/w anterior STEMI- Personally Reviewed  Physical Exam   GEN: mild distress-chest pain.   Neck: No JVD Cardiac: RRR, no murmurs, rubs, or gallops.  Respiratory: Clear to auscultation bilaterally. GI: Soft, nontender, non-distended  MS: No edema; No deformity. Pulses 2+ Neuro:  Nonfocal  Psych: Normal affect   Labs    Chemistry Recent Labs  Lab 01/04/19 0134 01/04/19 0502  NA 135 140  K 4.1 3.7  CL 104 102  CO2 22 21*  GLUCOSE 128* 105*  BUN 13 11  CREATININE 0.82 0.78  CALCIUM 8.8* 8.7*  GFRNONAA >60 >60    GFRAA >60 >60  ANIONGAP 9 17*     Hematology Recent Labs  Lab 01/04/19 0321 01/04/19 0502  WBC 9.0 8.8  RBC 6.20* 6.25*  HGB 14.7 15.0  HCT 45.6 45.6  MCV 73.5* 73.0*  MCH 23.7* 24.0*  MCHC 32.2 32.9  RDW 13.7 13.7  PLT 378 397    Cardiac Enzymes Recent Labs  Lab 01/04/19 0134 01/04/19 0502  TROPONINI 0.52* 1.84*    Recent Labs  Lab 01/04/19 0047  TROPIPOC 0.39*     BNPNo results for input(s): BNP, PROBNP in the last 168 hours.   DDimer No results for input(s): DDIMER in the last 168 hours.   Radiology    Dg Chest 2 View  Result Date: 01/04/2019 CLINICAL DATA:  Chest pain EXAM: CHEST - 2 VIEW COMPARISON:  None. FINDINGS: The heart size and mediastinal contours are within normal limits. Both lungs are clear. The visualized skeletal structures are unremarkable. IMPRESSION: No active cardiopulmonary disease. Electronically Signed   By: Jasmine Pang M.D.   On: 01/04/2019 01:14    Cardiac Studies     Patient Profile     53 y[place]  male admitted last PM with NSTEMI, developed increasing  chest pain 7:55 am with anterior STE c/w STEMI.   Assessment & Plan    STEMI- Pt transported to cath lab 8:40am. His pain had eased after IV NTG 5ug, IV Lopressor 5 mg, O2 2L, 4 ASA 81 mg.   Asthma- On inhalers at home  Plan- Code STEMI activated-pt transported to cath lab.   For questions or updates, please contact [facility] Please consult [email or web address] for contact info under        Signed, Corine Shelter, PA-C  01/04/2019, 8:45 AM    Cardiology Attending Patient seen and examined. ECG reviewed. He is having an anterior STEMI. We have had the patient chew up 4 ASA, continue IV heparin, and IV lopressor. He is not in shock with SBP of 140. He is having active chest pain with 10 mm ST elevation anteriorly. The cath lab team on call and Dr. Okey Dupre have been called in to perfomr primary PCI. He will be transferred to the CCU after PCI. 30 minutes was spent at  bedside, evaluating the patient, interpreting his ECG, ordering IV lopressor and discussing with Dr. Dian Situ Kiree Dejarnette,MD.

## 2019-01-05 ENCOUNTER — Encounter (HOSPITAL_COMMUNITY): Payer: Self-pay | Admitting: Internal Medicine

## 2019-01-05 DIAGNOSIS — I519 Heart disease, unspecified: Secondary | ICD-10-CM

## 2019-01-05 LAB — POCT ACTIVATED CLOTTING TIME
Activated Clotting Time: 318 seconds
Activated Clotting Time: 268 seconds

## 2019-01-05 LAB — CBC
HCT: 45.8 % (ref 39.0–52.0)
Hemoglobin: 14.4 g/dL (ref 13.0–17.0)
MCH: 22.9 pg — ABNORMAL LOW (ref 26.0–34.0)
MCHC: 31.4 g/dL (ref 30.0–36.0)
MCV: 72.7 fL — ABNORMAL LOW (ref 80.0–100.0)
Platelets: 397 10*3/uL (ref 150–400)
RBC: 6.3 MIL/uL — ABNORMAL HIGH (ref 4.22–5.81)
RDW: 13.8 % (ref 11.5–15.5)
WBC: 9 10*3/uL (ref 4.0–10.5)
nRBC: 0 % (ref 0.0–0.2)

## 2019-01-05 LAB — BASIC METABOLIC PANEL
Anion gap: 10 (ref 5–15)
BUN: 14 mg/dL (ref 6–20)
CO2: 23 mmol/L (ref 22–32)
Calcium: 9 mg/dL (ref 8.9–10.3)
Chloride: 102 mmol/L (ref 98–111)
Creatinine, Ser: 0.84 mg/dL (ref 0.61–1.24)
GFR calc Af Amer: >60 mL/min (ref >60)
GFR calc non Af Amer: >60 mL/min (ref >60)
GLUCOSE: 121 mg/dL — AB (ref 70–99)
Potassium: 3.8 mmol/L (ref 3.5–5.1)
Sodium: 135 mmol/L (ref 135–145)

## 2019-01-05 MED ORDER — ICOSAPENT ETHYL 1 G PO CAPS: 2.0000 g | ORAL_CAPSULE | Freq: Two times a day (BID) | ORAL | 0 refills | Status: DC

## 2019-01-05 MED ORDER — LISINOPRIL 5 MG PO TABS
5.0000 mg | ORAL_TABLET | Freq: Every day | ORAL | Status: DC
  Administered 2019-01-05 – 2019-01-06 (×2): 5 mg via ORAL
  Filled 2019-01-05 (×2): qty 1

## 2019-01-05 MED FILL — VASCEPA 1 GM CAPSULE: 1 | 30 days supply | Qty: 120 | Fill #0 | Status: TO

## 2019-01-05 MED FILL — Heparin Sod (Porcine)-NaCl IV Soln 1000 Unit/500ML-0.9%: INTRAVENOUS | Qty: 1000 | Status: AC

## 2019-01-05 MED FILL — Heparin Sod (Porcine)-NaCl IV Soln 1000 Unit/500ML-0.9%: INTRAVENOUS | Qty: 500 | Status: AC

## 2019-01-05 NOTE — Care Management (Signed)
Benefits check sent and pending for Brilinta and Vascepa.  Elleigh Cassetta RN, BSN, NCM-BC, ACM-RN [phone]   

## 2019-01-05 NOTE — Progress Notes (Addendum)
Progress Note  Patient Name: Mark Cole Date of Encounter: 01/05/2019  Primary Cardiologist: No primary care provider on file.   Subjective   Patient feeling much better this AM. No CP or SHOB. He has not ambulated yet. Discussed the results of his cath and plan for some changes to his medications. He is wondering when he can go back to work.   Inpatient Medications    Scheduled Meds: . aspirin EC  81 mg Oral Daily  . atorvastatin  80 mg Oral q1800  . enoxaparin (LOVENOX) injection  40 mg Subcutaneous Q24H  . metoprolol succinate  12.5 mg Oral QHS  . mometasone-formoterol  2 puff Inhalation BID  . pneumococcal 23 valent vaccine  0.5 mL Intramuscular Tomorrow-1000  . sodium chloride flush  3 mL Intravenous Once  . sodium chloride flush  3 mL Intravenous Q12H  . ticagrelor  90 mg Oral BID   Continuous Infusions: . sodium chloride 10 mL/hr at 01/05/19 0700   PRN Meds: sodium chloride, acetaminophen, albuterol, nitroGLYCERIN, ondansetron (ZOFRAN) IV, sodium chloride flush   Vital Signs    Vitals:   01/05/19 0500 01/05/19 0600 01/05/19 0700 01/05/19 0817  BP: 116/81 112/75 117/85   Pulse: 79 74 77   Resp: (!) 21 20 20    Temp:    98.4 F (36.9 C)  TempSrc:    Oral  SpO2: 91% 93% 92%   Weight: 67.1 kg     Height:        Intake/Output Summary (Last 24 hours) at 01/05/2019 0835 Last data filed at 01/05/2019 0700 Gross per 24 hour  Intake 444.51 ml  Output 775 ml  Net -330.49 ml   Filed Weights   01/04/19 0300 01/04/19 0336 01/05/19 0500  Weight: 65.7 kg 65.9 kg 67.1 kg   Telemetry    NSR - Personally Reviewed  ECG    NSR with TWI in the anterolateral leads and slight ST elevation in the inferior leads.  - Personally Reviewed  Physical Exam   Today's Vitals   01/05/19 0500 01/05/19 0600 01/05/19 0700 01/05/19 0817  BP: 116/81 112/75 117/85   Pulse: 79 74 77   Resp: (!) 21 20 20    Temp:    98.4 F (36.9 C)  TempSrc:    Oral  SpO2: 91% 93% 92%   Weight:  67.1 kg     Height:      PainSc:       Body mass index is 32.01 kg/m.  GEN: No acute distress.   Neck: No JVD Cardiac: RRR, no murmurs, rubs, or gallops.  Respiratory: Clear to auscultation bilaterally. GI: Soft, nontender, non-distended  MS: No edema; No deformity. Neuro:  Nonfocal  Psych: Normal affect   Labs    Chemistry Recent Labs  Lab 01/04/19 0134 01/04/19 0502 01/05/19 0235  NA 135 140 135  K 4.1 3.7 3.8  CL 104 102 102  CO2 22 21* 23  GLUCOSE 128* 105* 121*  BUN 13 11 14   CREATININE 0.82 0.78 0.84  CALCIUM 8.8* 8.7* 9.0  GFRNONAA >60 >60 >60  GFRAA >60 >60 >60  ANIONGAP 9 17* 10    Hematology Recent Labs  Lab 01/04/19 0321 01/04/19 0502 01/05/19 0235  WBC 9.0 8.8 9.0  RBC 6.20* 6.25* 6.30*  HGB 14.7 15.0 14.4  HCT 45.6 45.6 45.8  MCV 73.5* 73.0* 72.7*  MCH 23.7* 24.0* 22.9*  MCHC 32.2 32.9 31.4  RDW 13.7 13.7 13.8  PLT 378 397 397   Cardiac  Enzymes Recent Labs  Lab 01/04/19 0134 01/04/19 0502 01/04/19 0706 01/04/19 1422  TROPONINI 0.52* 1.84* 3.20* 5.73*    Recent Labs  Lab 01/04/19 0047  TROPIPOC 0.39*    BNPNo results for input(s): BNP, PROBNP in the last 168 hours.   DDimer No results for input(s): DDIMER in the last 168 hours.   Radiology    Dg Chest 2 View  Result Date: 01/04/2019 CLINICAL DATA:  Chest pain EXAM: CHEST - 2 VIEW COMPARISON:  None. FINDINGS: The heart size and mediastinal contours are within normal limits. Both lungs are clear. The visualized skeletal structures are unremarkable. IMPRESSION: No active cardiopulmonary disease. Electronically Signed   By: Jasmine Pang M.D.   On: 01/04/2019 01:14   Cardiac Studies   Left Heart Cath 01/04/2019  1. Severe single-vessel coronary artery disease with 100% thrombotic occlusion of the mid LAD (type 1 MI). 2. Mild to moderate, non-obstructive coronary artery disease involving the LCx and RCA. 3. Severely reduced left ventricular systolic function with severe  hypokinesis in the mid/distal LAD territory (LVEF ~30%). 4. Successful PCI to the mid LAD using Synergy 2.5 x 16 mm drug eluting stent (post-dilated to 3.1 mm) with 0% residual stenosis and TIMI-3 flow.  Diagnostic EKG to balloon time was delayed due to unfavorable aortic arch anatomy precluding guide catheter engagement from the right radial artery and necessitating second access via the right common femoral vein.  TTE 01/04/2019   1. The left ventricle has mild-moderately reduced systolic function, with an ejection fraction of 40-45%. The cavity size was normal. Left ventricular diastolic Doppler parameters are consistent with impaired relaxation.  2. Severe akinesis of the left ventricular apical segment.  3. Moderate hypokinesis of the left ventricular anteroseptal wall.  4. The right ventricle has normal systolic function. The cavity was normal. There is no increase in right ventricular wall thickness.  5. The tricuspid valve is normal in structure.  6. The pulmonic valve was normal in structure.  7. The aortic valve is normal in structure.  8. The mitral valve is normal in structure.  Patient Profile     53 y.o. male with a insignificant past medical history who presented to the hospital with chest pressure and elevated cardiac enzymes. He was subsequently taken for revascularization and found to have an occlusion of the mid LAD.  Assessment & Plan    NSTEMI  CAD s/p DES to LAD - CP and SHOB have resolved.  - EKG stable. - Echo with LVEF of 40-45%. No need for life vest  - Continue ASA and ticagrelor  - Continue metoprolol succinate but can increase to 25 mg  - Start lisinopril 5 mg  - Continue Atorvastatin 80 mg QHS  - Consider referral to lipid clinic for initiation of Icosapent Ethyl  - Ambulate patient  - Patient will need work note prior to discharge  HFmrEF - LVEF of 40-45%  - No need for life vest  - Continue metoprolol  - Start Lisinopril   Pre-diabetes  -  Consider starting metformin   Will discuss case further with Dr. Eldridge Dace.   For questions or updates, please contact [facility] Please consult [email or web address] for contact info under Cardiology/STEMI.   Signed, Levora Dredge, MD  01/05/2019, 8:35 AM    I have examined the patient and reviewed assessment and plan and discussed with patient.  Agree with above as stated.    S/p LAD stent.  Transfer to tele.  Meds for LV dysfunction.  Will also  need management of prediabetes.  No evidence of CHF today.  COntinue to watch on tele.  EF mildly decreased by echo and should get better over time.   Lance Muss

## 2019-01-05 NOTE — Care Management Note (Addendum)
Case Management Note  Patient Details  Name: Mark Cole MRN: [id number] Date of Birth: [birth year]  Subjective/Objective: 53 yo male presented with CP; s/p LAD stent                Action/Plan: CM consult acknowledged for medication assistance. CM met with patient/spouse to clarify insurance benefits/PCP information. Patient was asleep with demo information obtained from patients spouse. Patient was independent PTA, employed with no DME in use. PCP verified as: [facility] with appointment scheduled 02/05/19; [facility]. Patient has active insurance with [employer], with information obtained for requested benefits check for Brilinta/Vascepa. Brilinta 30-day free card was provided to spouse with [facility] pharmacy to provided Rx prior to patient transitioning home. CM team will continue to follow.    Expected Discharge Date:                  Expected Discharge Plan:  Home/Self Care  In-House Referral:  NA  Discharge planning Services  CM Consult, Medication Assistance(Benefits check Brilinta and Vascepa)  Post Acute Care Choice:  NA Choice offered to:  NA  DME Arranged:  N/A DME Agency:  NA  HH Arranged:  NA HH Agency:  NA  Status of Service:  In process, will continue to follow  If discussed at Long Length of Stay Meetings, dates discussed:    Additional Comments: 01/05/19 @ 1611-Renata Gambino RNCM-CM discussed the copay costs and provided copay cards for Brilinta and Vascepa to patient and his spouse with both verbalizing understanding. The Vascepa copay card will decrease the cost to as low as $9; TOC is following for Rx needs.   Midge Minium RN, BSN, NCM-BC, ACM-RN [phone] 01/05/2019, 11:43 AM

## 2019-01-05 NOTE — Progress Notes (Signed)
Patient has transfer order to medical Telemetry. Patient will relocate to room 3E30. Report called to RN

## 2019-01-05 NOTE — Care Management (Addendum)
#    9.   S/W  Mark B. @  [facility] [facility] # [id number]   TICAGRELOR : NON-FORMULARY  1. BRILINTA  90 MG BID  COVER- YES CO-PAY- $ 20.00 TIER- 3 DRUG PRIOR APPROVAL- NO 90 DAY SUPPLY  FOR M/O  $ 60.00  BRAND : 2, VASCEPA  2 GM  BID COVER- YES PRIOR APPROVAL- YES # [id number] OPT- 3  & OPT- 2  GENERIC :  3 ICOSAPENT  ETHYL  2 GM BID COVER- YES PRIOR APPROVAL- YES # [phone]  OPT- 3 & OPT- 2   PREFERRED PHARMACY : YES [facility] AND [facility]

## 2019-01-05 NOTE — Progress Notes (Signed)
CARDIAC REHAB PHASE I   PRE:  Rate/Rhythm: 82 SR    BP: sitting 128/82    SaO2:   MODE:  Ambulation: 370 ft   POST:  Rate/Rhythm: 100 SR    BP: sitting 131/100, recheck 126/97    SaO2: 94 RA   Tolerated well, no c/o. BP elevated after walk. Gave materials for ed however pt eating lunch and visitors came. Apparently their language is not available for translation so wife will translate. Will not give waiver.  [phone]  Harriet Masson CES, ACSM 01/05/2019 11:31 AM

## 2019-01-06 ENCOUNTER — Encounter (HOSPITAL_COMMUNITY): Payer: Self-pay | Admitting: Cardiology

## 2019-01-06 DIAGNOSIS — E118 Type 2 diabetes mellitus with unspecified complications: Secondary | ICD-10-CM

## 2019-01-06 DIAGNOSIS — Z794 Long term (current) use of insulin: Secondary | ICD-10-CM

## 2019-01-06 DIAGNOSIS — I25110 Atherosclerotic heart disease of native coronary artery with unstable angina pectoris: Secondary | ICD-10-CM

## 2019-01-06 DIAGNOSIS — I2510 Atherosclerotic heart disease of native coronary artery without angina pectoris: Secondary | ICD-10-CM

## 2019-01-06 DIAGNOSIS — E782 Mixed hyperlipidemia: Secondary | ICD-10-CM

## 2019-01-06 DIAGNOSIS — R7303 Prediabetes: Secondary | ICD-10-CM

## 2019-01-06 DIAGNOSIS — E785 Hyperlipidemia, unspecified: Secondary | ICD-10-CM

## 2019-01-06 HISTORY — DX: Atherosclerotic heart disease of native coronary artery without angina pectoris: I25.10

## 2019-01-06 HISTORY — DX: Long term (current) use of insulin: E11.8

## 2019-01-06 HISTORY — DX: Long term (current) use of insulin: Z79.4

## 2019-01-06 MED ORDER — ATORVASTATIN CALCIUM 80 MG PO TABS: 80.0000 mg | ORAL_TABLET | Freq: Every day | ORAL | 2 refills | Status: DC

## 2019-01-06 MED ORDER — ICOSAPENT ETHYL 1 G PO CAPS: 2.0000 g | ORAL_CAPSULE | Freq: Two times a day (BID) | ORAL | 0 refills | Status: DC

## 2019-01-06 MED ORDER — METOPROLOL SUCCINATE ER 25 MG PO TB24: 25.0000 mg | ORAL_TABLET | Freq: Every day | ORAL | 2 refills | Status: DC

## 2019-01-06 MED ORDER — TICAGRELOR 90 MG PO TABS: 90.0000 mg | ORAL_TABLET | Freq: Two times a day (BID) | ORAL | 2 refills | Status: DC

## 2019-01-06 MED ORDER — NITROGLYCERIN 0.4 MG SL SUBL: 0.4000 mg | SUBLINGUAL_TABLET | SUBLINGUAL | 1 refills | Status: DC | PRN

## 2019-01-06 MED ORDER — METFORMIN HCL 500 MG PO TABS: 500.0000 mg | ORAL_TABLET | Freq: Two times a day (BID) | ORAL | 2 refills | Status: DC

## 2019-01-06 MED ORDER — LISINOPRIL 5 MG PO TABS: 5.0000 mg | ORAL_TABLET | Freq: Every day | ORAL | 2 refills | Status: DC

## 2019-01-06 MED ORDER — ASPIRIN 81 MG PO TBEC: 81.0000 mg | DELAYED_RELEASE_TABLET | Freq: Every day | ORAL | Status: DC

## 2019-01-06 MED FILL — ATORVASTATIN CALCIUM 80 MG: 80 | 30 days supply | Qty: 30 | Fill #0 | Status: TO

## 2019-01-06 MED FILL — metFORMIN HCL 500 MG TABS: 500 | 30 days supply | Qty: 60 | Fill #0 | Status: TO

## 2019-01-06 MED FILL — METOPROLOL SUCCINATE ER 25: 25 | 30 days supply | Qty: 30 | Fill #0 | Status: TO

## 2019-01-06 MED FILL — NITROGLYCERIN 0.4 MG TAB SL: 0.4 | 8 days supply | Qty: 25 | Fill #0 | Status: TO

## 2019-01-06 MED FILL — LISINOPRIL 5 MG TABLET: 5 | 30 days supply | Qty: 30 | Fill #0 | Status: TO

## 2019-01-06 MED FILL — BRILINTA 90 MG TABLET: 90 | 30 days supply | Qty: 60 | Fill #0 | Status: TO

## 2019-01-06 NOTE — Progress Notes (Addendum)
Progress Note  Patient Name: Mark Cole Date of Encounter: 01/06/2019  Primary Cardiologist: No primary care provider on file.   Subjective   Patient feeling well. No CP or SHOB. Feels tired. He was able to ambulate with cardiac rehab yesterday without issues.   Inpatient Medications    Scheduled Meds: . aspirin EC  81 mg Oral Daily  . atorvastatin  80 mg Oral q1800  . enoxaparin (LOVENOX) injection  40 mg Subcutaneous Q24H  . lisinopril  5 mg Oral Daily  . metoprolol succinate  12.5 mg Oral QHS  . mometasone-formoterol  2 puff Inhalation BID  . sodium chloride flush  3 mL Intravenous Once  . sodium chloride flush  3 mL Intravenous Q12H  . ticagrelor  90 mg Oral BID   Continuous Infusions: . sodium chloride Stopped (01/05/19 0748)   PRN Meds: sodium chloride, acetaminophen, albuterol, nitroGLYCERIN, ondansetron (ZOFRAN) IV, sodium chloride flush   Vital Signs    Vitals:   01/05/19 2223 01/06/19 0153 01/06/19 0642 01/06/19 0753  BP:  110/84 104/68   Pulse:  90 79 79  Resp:  18 16 18   Temp:  99.7 F (37.6 C) 98.4 F (36.9 C)   TempSrc:  Oral Oral   SpO2: 96% 98% 94% 97%  Weight:   65 kg   Height:        Intake/Output Summary (Last 24 hours) at 01/06/2019 0909 Last data filed at 01/06/2019 0156 Gross per 24 hour  Intake 3 ml  Output 400 ml  Net -397 ml   Filed Weights   01/05/19 0500 01/05/19 2019 01/06/19 0642  Weight: 67.1 kg 65.2 kg 65 kg   Telemetry    NSR - Personally Reviewed  ECG    No new ECG  Physical Exam   GEN: No acute distress.   Neck: No JVD Cardiac: RRR, no murmurs, rubs, or gallops.  Respiratory: Clear to auscultation bilaterally. GI: Soft, nontender, non-distended  MS: No edema; No deformity. Neuro:  Nonfocal  Psych: Normal affect   Labs    Chemistry Recent Labs  Lab 01/04/19 0134 01/04/19 0502 01/05/19 0235  NA 135 140 135  K 4.1 3.7 3.8  CL 104 102 102  CO2 22 21* 23  GLUCOSE 128* 105* 121*  BUN 13 11 14     CREATININE 0.82 0.78 0.84  CALCIUM 8.8* 8.7* 9.0  GFRNONAA >60 >60 >60  GFRAA >60 >60 >60  ANIONGAP 9 17* 10    Hematology Recent Labs  Lab 01/04/19 0321 01/04/19 0502 01/05/19 0235  WBC 9.0 8.8 9.0  RBC 6.20* 6.25* 6.30*  HGB 14.7 15.0 14.4  HCT 45.6 45.6 45.8  MCV 73.5* 73.0* 72.7*  MCH 23.7* 24.0* 22.9*  MCHC 32.2 32.9 31.4  RDW 13.7 13.7 13.8  PLT 378 397 397   Cardiac Enzymes Recent Labs  Lab 01/04/19 0134 01/04/19 0502 01/04/19 0706 01/04/19 1422  TROPONINI 0.52* 1.84* 3.20* 5.73*    Recent Labs  Lab 01/04/19 0047  TROPIPOC 0.39*    BNPNo results for input(s): BNP, PROBNP in the last 168 hours.   DDimer No results for input(s): DDIMER in the last 168 hours.   Radiology    No results found.   Cardiac Studies   Left Heart Cath 01/04/2019  1. Severe single-vessel coronary artery disease with 100% thrombotic occlusion of the mid LAD (type 1 MI). 2. Mild to moderate, non-obstructive coronary artery disease involving the LCx and RCA. 3. Severely reduced left ventricular systolic function with severe  hypokinesis in the mid/distal LAD territory (LVEF ~30%). 4. Successful PCI to the mid LAD using Synergy 2.5 x 16 mm drug eluting stent (post-dilated to 3.1 mm) with 0% residual stenosis and TIMI-3 flow.  Diagnostic EKG to balloon time was delayed due to unfavorable aortic arch anatomy precluding guide catheter engagement from the right radial artery and necessitating second access via the right common femoral vein.  TTE 01/04/2019   1. The left ventricle has mild-moderately reduced systolic function, with an ejection fraction of 40-45%. The cavity size was normal. Left ventricular diastolic Doppler parameters are consistent with impaired relaxation.  2. Severe akinesis of the left ventricular apical segment.  3. Moderate hypokinesis of the left ventricular anteroseptal wall.  4. The right ventricle has normal systolic function. The cavity was normal. There is no  increase in right ventricular wall thickness.  5. The tricuspid valve is normal in structure.  6. The pulmonic valve was normal in structure.  7. The aortic valve is normal in structure.  8. The mitral valve is normal in structure.  Patient Profile     53 y.o. male with a insignificant past medical history who presented to the hospital with chest pressure and elevated cardiac enzymes. He was subsequently taken for revascularization and found to have an occlusion of the mid LAD.  Assessment & Plan    NSTEMI  CAD s/p DES to LAD - CP and SHOB have resolved.  - Continue ASA and ticagrelor  - Continue metoprolol succinate 12.5 mg QD and lisinopril 5 mg  - Continue Atorvastatin 80 mg QHS  - Consider referral to lipid clinic for initiation of Icosapent Ethyl  - Patient will need work note prior to discharge  HFmrEF - LVEF of 40-45%  - No need for life vest  - Continue metoprolol and Lisinopril   Pre-diabetes  - Consider starting metformin   Will discuss case further with Dr. Eldridge Dace.   For questions or updates, please contact [facility] Please consult [email or web address] for contact info under Cardiology/STEMI.   Signed, Levora Dredge, MD  01/06/2019, 9:09 AM    I have examined the patient and reviewed assessment and plan and discussed with patient.  Agree with above as stated.    Doing well.    His job requires heavy lifting, but he states his job will accept a note for light duty.  Out of work for 3 weeks.  THen he can return but no heavy lifting, no more than 20 lbs., until he has his f/u echo in 6 weeks.    Vascepa also approved for high TG.  Metformin for prediabetes.   Continue aggressive secondary prevention including DAPT and medical treatment for LV dysfunction.  OK for discharge later today.  Lance Muss

## 2019-01-06 NOTE — Progress Notes (Signed)
CARDIAC REHAB PHASE I   PRE:  Rate/Rhythm: 87 SR (?moving)    BP: sitting 111/75    SaO2:   MODE:  Ambulation: 450 ft   POST:  Rate/Rhythm: 87 SR    BP: sitting 111/79     SaO2:   Tolerated well, no c/o. Ed completed with pt and wife. They understood well, asked appropriate questions. His stent card and other materials were left on 2H yesterday. I called but it was thrown away. I gave him all of the materials again except for his stent card, which cannot be replaced.  Understands MI, stent, Brilinta and other meds (got them out and showed bottles), diet (discussed pre DM as well), ex, NTG, and CRPII. Will refer to [place] CRPII.  [id number]  Harriet Masson CES, ACSM 01/06/2019 11:55 AM

## 2019-01-06 NOTE — Progress Notes (Addendum)
Benefit check in progress for  affordability of Vascepa 2 gm twice daily; B Rami Waddle RN,MHA,BSN [phone]  10:59 pm - Prior approval needed -  VASCEPA  2 GM  BID COVER- YES PRIOR APPROVAL- YES # [phone] OPT- 3  & OPT- 2

## 2019-01-06 NOTE — Discharge Summary (Addendum)
Discharge Summary    Patient ID: Mark Cole,  MRN: [id number], DOB/AGE: 03-23-1966 53 y.o.  Admit date: 01/04/2019 Discharge date: 01/06/2019  Primary Care Provider: No primary care provider on file. Primary Cardiologist: Dr. Eldridge DaceVaranasi  Discharge Diagnoses    Principal Problem:   STEMI (ST elevation myocardial infarction) [facility](HCC) Active Problems:   Hyperlipidemia   CAD (coronary artery disease)   Type 2 diabetes mellitus with complication, with long-term current use of insulin (HCC)   Allergies No Known Allergies  Diagnostic Studies/Procedures    Cath: 01/04/2019  Conclusions: 1. Severe single-vessel coronary artery disease with 100% thrombotic occlusion of the mid LAD (type 1 MI). 2. Mild to moderate, non-obstructive coronary artery disease involving the LCx and RCA. 3. Severely reduced left ventricular systolic function with severe hypokinesis in the mid/distal LAD territory (LVEF ~30%). 4. Successful PCI to the mid LAD using Synergy 2.5 x 16 mm drug eluting stent (post-dilated to 3.1 mm) with 0% residual stenosis and TIMI-3 flow.  Diagnostic EKG to balloon time was delayed due to unfavorable aortic arch anatomy precluding guide catheter engagement from the right radial artery and necessitating second access via the right common femoral artery.  Recommendations: 1. Transfer to 2-Heart for post-STEMI monitoring and medical therapy. 2. Dual antiplatelet therapy with aspirin and ticagrelor for at least 12 months. 3. Aggressive secondary prevention, including high-intensity statin therapy. 4. Optimize evidence based heart failure therapy.  I wills start metoprolol trartrate 12.5 mg daily this evening.  If blood pressure and renal function allow, consider adding ACEI/ARB tomorrow. 5. Maintain net even to slightly negative fluid balance today, given severely reduced LVEF and mildly elevated LVEDP.  Consider gentle diuresis beginning tomorrow, as blood pressure and renal function  allow.  Yvonne Kendallhristopher End, MD  TTE: 01/04/2019  IMPRESSIONS    1. The left ventricle has mild-moderately reduced systolic function, with an ejection fraction of 40-45%. The cavity size was normal. Left ventricular diastolic Doppler parameters are consistent with impaired relaxation.  2. Severe akinesis of the left ventricular apical segment.  3. Moderate hypokinesis of the left ventricular anteroseptal wall.  4. The right ventricle has normal systolic function. The cavity was normal. There is no increase in right ventricular wall thickness.  5. The tricuspid valve is normal in structure.  6. The pulmonic valve was normal in structure.  7. The aortic valve is normal in structure.  8. The mitral valve is normal in structure. _____________   History of Present Illness     53 y.o. male with a history of asthma who is admitted with NSTEMI.   The patient has no cardiac history. He is [place] and speaks some [place]; his wife was at bedside and offered further interpretation. 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 This pain was a reflux-like sensation radiating to the jaw that improved with belching. He denied any dyspnea or other associated symptoms. The pain resolved spontaneously after one hour. He developed the pain again the day of admission at rest and came to the ED.   He presented to the ED with chest pain. In the ED, ECG showed NSR with 1 mm ST elevation in V1 and V2, < 1 mm ST elevation in V3, with large and biphasic T waves in anterior precordial leads; there is ST depressions in V4, I, AVL. Initial troponin 0.39. CXR  unremarkable. He was started on a heparin gtt and cardiology was consulted for admission.  On evaluation, the patient is resting in bed. He denied any chest pain or other symptoms whatsoever at the time of my evaluation. ECG  repeated and was stable from prior.  Hospital Course     NSTEMI  CAD s/p DES to LAD: troponin peaked at 5.73.  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   HFmrEF - LVEF of 40-45%  - No need for life vest  - tolerating metoprolol and Lisinopril   Pre-diabetes  - Hgb A1c 6.3, started on metformin 500mg  BID  HL -on high does statin. LDL 196  Mark Cole was seen by Dr. Eldridge Dace and determined stable for discharge home. Follow up in the office has been arranged. Medications are listed below.   _____________  Discharge Vitals Blood pressure 100/78, pulse 79, temperature 97.8 F (36.6 C), temperature source Oral, resp. rate 18, height 5' (1.524 m), weight 65 kg, SpO2 94 %.  Filed Weights   01/05/19 0500 01/05/19 2019 01/06/19 0642  Weight: 67.1 kg 65.2 kg 65 kg    Labs & Radiologic Studies    CBC Recent Labs    01/04/19 0502 01/05/19 0235  WBC 8.8 9.0  HGB 15.0 14.4  HCT 45.6 45.8  MCV 73.0* 72.7*  PLT 397 397   Basic Metabolic Panel Recent Labs    69/48/54 0502 01/05/19 0235  NA 140 135  K 3.7 3.8  CL 102 102  CO2 21* 23  GLUCOSE 105* 121*  BUN 11 14  CREATININE 0.78 0.84  CALCIUM 8.7* 9.0   Liver Function Tests No results for input(s): AST, ALT, ALKPHOS, BILITOT, PROT, ALBUMIN in the last 72 hours. No results for input(s): LIPASE, AMYLASE in the last 72 hours. Cardiac Enzymes Recent Labs    01/04/19 0502 01/04/19 0706 01/04/19 1422  TROPONINI 1.84* 3.20* 5.73*   BNP Invalid input(s): POCBNP D-Dimer No results for input(s): DDIMER in the last 72 hours. Hemoglobin A1C Recent Labs    01/04/19 0502  HGBA1C 6.3*   Fasting Lipid Panel Recent Labs    01/04/19 0502  CHOL 294*  HDL 54  LDLCALC 196*  TRIG 222*  CHOLHDL 5.4   Thyroid Function  Tests No results for input(s): TSH, T4TOTAL, T3FREE, THYROIDAB in the last 72 hours.  Invalid input(s): FREET3 _____________  Dg Chest 2 View  Result Date: 01/04/2019 CLINICAL DATA:  Chest pain EXAM: CHEST - 2 VIEW COMPARISON:  None. FINDINGS: The heart size and mediastinal contours are within normal limits. Both lungs are clear. The visualized skeletal structures are unremarkable. IMPRESSION: No active cardiopulmonary disease. Electronically Signed   By: Jasmine Pang M.D.   On: 01/04/2019 01:14   Disposition   Pt is being discharged home today in good condition.  Follow-up Plans & Appointments    Follow-up Information    Dyann Kief, PA-C Follow up on 01/21/2019.   Specialty:  Cardiology Why:  at 12pm for your follow up appt.  Contact information: [address] [place] [address] [phone]          Discharge Instructions    AMB Referral to Cardiac Rehabilitation - Phase II   Complete by:  As directed    Diagnosis:  Coronary Stents STEMI     Diet - low sodium heart healthy   Complete by:  As directed    Discharge instructions   Complete by:  As directed    Radial Site Care Refer to this sheet in the next few weeks. These instructions provide you with information on caring for yourself after your procedure. Your caregiver may also give you more specific instructions. Your treatment has been planned according to current medical practices, but problems sometimes occur. Call your caregiver if you have any problems or questions after your procedure. HOME CARE INSTRUCTIONS You may shower the day after the procedure.Remove the bandage (dressing) and gently wash the site with plain soap and water.Gently pat the site dry.  Do not apply powder or lotion to the site.  Do not submerge the affected site in water for 3 to 5 days.  Inspect the site at least twice daily.  Do not flex or bend the affected arm for 24 hours.  No lifting over 5 pounds (2.3 kg)  for 5 days after your procedure.  Do not drive home if you are discharged the same day of the procedure. Have someone else drive you.  You may drive 24 hours after the procedure unless otherwise instructed by your caregiver.  What to expect: Any bruising will usually fade within 1 to 2 weeks.  Blood that collects in the tissue (hematoma) may be painful to the touch. It should usually decrease in size and tenderness within 1 to 2 weeks.  SEEK IMMEDIATE MEDICAL CARE IF: You have unusual pain at the radial site.  You have redness, warmth, swelling, or pain at the radial site.  You have drainage (other than a small amount of blood on the dressing).  You have chills.  You have a fever or persistent symptoms for more than 72 hours.  You have a fever and your symptoms suddenly get worse.  Your arm becomes pale, cool, tingly, or numb.  You have heavy bleeding from the site. Hold pressure on the site.   PLEASE DO NOT MISS ANY DOSES OF YOUR BRILINTA!!!!! Also keep a log of you blood pressures and bring back to your follow up appt. Please call the office with any questions.   Patients taking blood thinners should generally stay away from medicines like ibuprofen, Advil, Motrin, naproxen, and Aleve due to risk of stomach bleeding. You may take Tylenol as directed or talk to your primary doctor about alternatives.   Increase activity slowly   Complete by:  As directed       Discharge Medications     Medication List    TAKE these medications   albuterol 108 (90 Base) MCG/ACT inhaler Commonly known as:  PROAIR HFA Inhale 2 puffs into the lungs every 4 (four) hours as needed for wheezing or shortness of breath.   aspirin 81 MG EC tablet Take 1 tablet (81 mg total) by mouth daily.   atorvastatin 80 MG tablet Commonly known as:  LIPITOR Take 1 tablet (80 mg total) by mouth daily at 6 PM.   budesonide-formoterol 160-4.5 MCG/ACT inhaler Commonly known as:  SYMBICORT Inhale 2 puffs into the  lungs 2 (two) times daily.   Icosapent Ethyl 1 g Caps Commonly known as:  VASCEPA Take 2 capsules (2 g total) by mouth 2 (two) times daily for 30 days.   lisinopril 5 MG tablet Commonly known as:  PRINIVIL,ZESTRIL Take 1 tablet (5 mg total) by mouth daily.   metFORMIN 500 MG tablet  Commonly known as:  GLUCOPHAGE Take 1 tablet (500 mg total) by mouth 2 (two) times daily with a meal.   metoprolol succinate 25 MG 24 hr tablet Commonly known as:  TOPROL-XL Take 1 tablet (25 mg total) by mouth at bedtime.   nitroGLYCERIN 0.4 MG SL tablet Commonly known as:  NITROSTAT Place 1 tablet (0.4 mg total) under the tongue every 5 (five) minutes x 3 doses as needed for chest pain.   ticagrelor 90 MG Tabs tablet Commonly known as:  BRILINTA Take 1 tablet (90 mg total) by mouth 2 (two) times daily.       Acute coronary syndrome (MI, NSTEMI, STEMI, etc) this admission?: Yes.     AHA/ACC Clinical Performance & Quality Measures: 1. Aspirin prescribed? - Yes 2. ADP Receptor Inhibitor (Plavix/Clopidogrel, Brilinta/Ticagrelor or Effient/Prasugrel) prescribed (includes medically managed patients)? - Yes 3. Beta Blocker prescribed? - Yes 4. High Intensity Statin (Lipitor 40-80mg  or Crestor 20-40mg ) prescribed? - Yes 5. EF assessed during THIS hospitalization? - Yes 6. For EF <40%, was ACEI/ARB prescribed? - Yes 7. For EF <40%, Aldosterone Antagonist (Spironolactone or Eplerenone) prescribed? - Not Applicable (EF >/= 40%) 8. Cardiac Rehab Phase II ordered (Included Medically managed Patients)? - Yes  Outstanding Labs/Studies   BMET at follow up appt. LFTs/FLP in 6 weeks if tolerating statin.  Duration of Discharge Encounter   Greater than 30 minutes including physician time.  Signed, Laverda Page NP-C 01/06/2019, 9:42 AM   I have examined the patient and reviewed assessment and plan and discussed with patient.  Agree with above as stated.    Doing well.    His job requires heavy  lifting, but he states his job will accept a note for light duty.  Out of work for 3 weeks.  THen he can return but no heavy lifting, no more than 20 lbs., until he has his f/u echo in 6 weeks.    Vascepa also approved for high TG.  Metformin for prediabetes.   Continue aggressive secondary prevention including DAPT and medical treatment for LV dysfunction.  OK for discharge later today.  Lance Muss

## 2019-01-21 ENCOUNTER — Ambulatory Visit: Payer: BLUE CROSS/BLUE SHIELD | Admitting: Physician Assistant

## 2019-01-21 ENCOUNTER — Other Ambulatory Visit: Payer: Self-pay

## 2019-01-21 ENCOUNTER — Encounter: Payer: Self-pay | Admitting: Physician Assistant

## 2019-01-21 VITALS — BP 112/76 | HR 82 | Ht 60.0 in | Wt 142.8 lb

## 2019-01-21 DIAGNOSIS — I5022 Chronic systolic (congestive) heart failure: Secondary | ICD-10-CM | POA: Diagnosis not present

## 2019-01-21 DIAGNOSIS — I25110 Atherosclerotic heart disease of native coronary artery with unstable angina pectoris: Secondary | ICD-10-CM

## 2019-01-21 DIAGNOSIS — E782 Mixed hyperlipidemia: Secondary | ICD-10-CM

## 2019-01-21 DIAGNOSIS — I10 Essential (primary) hypertension: Secondary | ICD-10-CM

## 2019-01-21 DIAGNOSIS — I255 Ischemic cardiomyopathy: Secondary | ICD-10-CM | POA: Diagnosis not present

## 2019-01-21 MED ORDER — PANTOPRAZOLE SODIUM 40 MG PO TBEC: 40.0000 mg | DELAYED_RELEASE_TABLET | Freq: Every day | ORAL | 11 refills | Status: DC

## 2019-01-21 NOTE — Patient Instructions (Addendum)
Medication Instructions:  1) START PROTONIX 40 mg daily  Labwork: Come FASTING (no food after midnight) to your appointment with Dr. Eldridge Dace.  Testing/Procedures: Your provider has requested that you have an echocardiogram in 3 months. Echocardiography is a painless test that uses sound waves to create images of your heart. It provides your doctor with information about the size and shape of your heart and how well your heart's chambers and valves are working. This procedure takes approximately one hour. There are no restrictions for this procedure. You are scheduled for your echo 04/23/2019 at 11:30AM. Please arrive by 11:00AM.  Follow-Up: You have an appointment with Dr. Eldridge Dace on 04/08/2019 at 9:40AM. Remember to come fasting as we will check your cholesterol!

## 2019-01-21 NOTE — Progress Notes (Signed)
Cardiology Office Note    Date:  01/21/2019   ID:  Mark Cole, DOB Sep 23, 1966, MRN [id number]  PCP:  Patient, No Pcp Per  Cardiologist: Lance Muss, MD EPS: None  No chief complaint on file.   History of Present Illness:  Mark Cole is a 53 y.o. male [place] patient that speaks some English with no prior cardiac history was admitted with STEMI with DES to the LAD 01/04/19 peak troponin V 0.73 sent home on Brilinta and aspirin.  LV EF 40 to 45% on lisinopril metoprolol, prediabetes on metformin and hyperlipidemia with LDL of 196 started on high-dose statin.  Patient comes in today accompanied by his wife.  He complains of a lot of nausea since his MI.  He denies chest pain, palpitations, dyspnea or dyspnea on exertion.  He has an appointment with a PCP to become established next week.  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    Past Medical History:  Diagnosis Date  . Asthma   . CAD (coronary artery disease) 01/06/2019  . Diabetes (HCC)   . Hyperlipidemia   . 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 Reactive airway disease 11/14/2013  . STEMI (ST elevation myocardial infarction) (HCC)   . Type 2 diabetes mellitus with complication, with long-term current use of insulin (HCC) 01/06/2019    Past Surgical History:  Procedure Laterality Date  . CORONARY/GRAFT ACUTE MI REVASCULARIZATION N/A 01/04/2019   Procedure: Coronary/Graft Acute MI Revascularization;  Surgeon: Yvonne Kendall, MD;  Location: [facility] INVASIVE CV LAB;  Service: Cardiovascular;  Laterality: N/A;  . LEFT HEART CATH AND CORONARY ANGIOGRAPHY N/A 01/04/2019   Procedure: LEFT HEART CATH AND CORONARY ANGIOGRAPHY;  Surgeon: Yvonne Kendall, MD;  Location: [facility] INVASIVE CV LAB;  Service: Cardiovascular;  Laterality: N/A;    Current Medications: Current Meds  Medication Sig  . albuterol (PROAIR HFA) 108 (90 Base) MCG/ACT inhaler Inhale 2 puffs into the lungs every 4 (four) hours as needed for wheezing or  shortness of breath.  Marland Kitchen aspirin EC 81 MG EC tablet Take 1 tablet (81 mg total) by mouth daily.  Marland Kitchen atorvastatin (LIPITOR) 80 MG tablet Take 1 tablet (80 mg total) by mouth daily at 6 PM.  . budesonide-formoterol (SYMBICORT) 160-4.5 MCG/ACT inhaler Inhale 2 puffs into the lungs 2 (two) times daily.  Bess Harvest Ethyl (VASCEPA) 1 g CAPS Take 2 capsules (2 g total) by mouth 2 (two) times daily for 30 days.  Marland Kitchen lisinopril (PRINIVIL,ZESTRIL) 5 MG tablet Take 1 tablet (5 mg total) by mouth daily.  . metFORMIN (GLUCOPHAGE) 500 MG tablet Take 1 tablet (500 mg total) by mouth 2 (two) times daily with a meal.  . metoprolol succinate (TOPROL-XL) 25 MG 24 hr tablet Take 1 tablet (25 mg total) by mouth at bedtime.  . nitroGLYCERIN (NITROSTAT) 0.4 MG SL tablet Place 1 tablet (0.4 mg total) under the tongue every 5 (five) minutes x 3 doses as needed for chest pain.  . ticagrelor (BRILINTA) 90 MG TABS tablet Take 1 tablet (90 mg total) by mouth 2 (two) times daily.     Allergies:   Patient has no known allergies.   Social History   Socioeconomic History  . Marital status: Married    Spouse name: Not on file  . Number of children: 3  . Years of education: Not on file  . Highest education level: Not on file  Occupational History  . Occupation: assembly work    Comment: Manufacturing systems engineer  Social  Needs  . Financial resource strain: Not on file  . Food insecurity:    Worry: Not on file    Inability: Not on file  . Transportation needs:    Medical: Not on file    Non-medical: Not on file  Tobacco Use  . Smoking status: Former Smoker    Types: Cigarettes    Last attempt to quit: 12/07/2010    Years since quitting: 8.1  . Smokeless tobacco: Never Used  Substance and Sexual Activity  . Alcohol use: No    Alcohol/week: 0.0 standard drinks  . Drug use: No  . Sexual activity: Not on file  Lifestyle  . Physical activity:    Days per week: Not on file    Minutes per session: Not on file  . Stress: Not  on file  Relationships  . Social connections:    Talks on phone: Not on file    Gets together: Not on file    Attends religious service: Not on file    Active member of club or organization: Not on file    Attends meetings of clubs or organizations: Not on file    Relationship status: Not on file  Other Topics Concern  . Not on file  Social History Narrative   Marital status: married; from [place]; moved to [place] in 1992      Children: 3 children      Employment: Dispensing optician      Tobacco: quit smoking age 62; started smoking age 89      Alcohol: none     Family History:  The patient's family history is not on file.   ROS:   Please see the history of present illness.    Review of Systems  Constitution: Negative.  HENT: Positive for hoarse voice.   Cardiovascular: Negative.   Respiratory: Negative.   Endocrine: Negative.   Hematologic/Lymphatic: Negative.   Musculoskeletal: Negative.   Gastrointestinal: Positive for nausea.  Genitourinary: Negative.   Neurological: Negative.    All other systems reviewed and are negative.   PHYSICAL EXAM:   VS:  BP 112/76   Pulse 82   Ht 5' (1.524 m)   Wt 142 lb 12.8 oz (64.8 kg)   SpO2 96%   BMI 27.89 kg/m   Physical Exam  GEN: Well nourished, well developed, in no acute distress  Neck: no JVD, carotid bruits, or masses Cardiac:RRR; no murmurs, rubs, or gallops  Respiratory:  clear to auscultation bilaterally, normal work of breathing GI: soft, nontender, nondistended, + BS Ext: Right arm at cath site without hematoma or hemorrhage, good radial brachial pulses, lower extremities without cyanosis, clubbing, or edema, Good distal pulses bilaterally Neuro:  Alert and Oriented x 3 Psych: euthymic mood, full affect  Wt Readings from Last 3 Encounters:  01/21/19 142 lb 12.8 oz (64.8 kg)  01/06/19 143 lb 4.8 oz (65 kg)  11/29/18 144 lb 12.8 oz (65.7 kg)      Studies/Labs Reviewed:   EKG:  EKG is not ordered today.    Recent Labs: 01/05/2019: BUN 14; Creatinine, Ser 0.84; Hemoglobin 14.4; Platelets 397; Potassium 3.8; Sodium 135   Lipid Panel    Component Value Date/Time   CHOL 294 (H) 01/04/2019 0502   TRIG 222 (H) 01/04/2019 0502   HDL 54 01/04/2019 0502   CHOLHDL 5.4 01/04/2019 0502   VLDL 44 (H) 01/04/2019 0502   LDLCALC 196 (H) 01/04/2019 0502    Additional studies/ records that were reviewed today include:  Cath: 01/04/2019   Conclusions: 1. Severe single-vessel coronary artery disease with 100% thrombotic occlusion of the mid LAD (type 1 MI). 2. Mild to moderate, non-obstructive coronary artery disease involving the LCx and RCA. 3. Severely reduced left ventricular systolic function with severe hypokinesis in the mid/distal LAD territory (LVEF ~30%). 4. Successful PCI to the mid LAD using Synergy 2.5 x 16 mm drug eluting stent (post-dilated to 3.1 mm) with 0% residual stenosis and TIMI-3 flow.  Diagnostic EKG to balloon time was delayed due to unfavorable aortic arch anatomy precluding guide catheter engagement from the right radial artery and necessitating second access via the right common femoral artery.   Recommendations: 1. Transfer to 2-Heart for post-STEMI monitoring and medical therapy. 2. Dual antiplatelet therapy with aspirin and ticagrelor for at least 12 months. 3. Aggressive secondary prevention, including high-intensity statin therapy. 4. Optimize evidence based heart failure therapy.  I wills start metoprolol trartrate 12.5 mg daily this evening.  If blood pressure and renal function allow, consider adding ACEI/ARB tomorrow. 5. Maintain net even to slightly negative fluid balance today, given severely reduced LVEF and mildly elevated LVEDP.  Consider gentle diuresis beginning tomorrow, as blood pressure and renal function allow.   Yvonne Kendall, MD   TTE: 01/04/2019   IMPRESSIONS      1. The left ventricle has mild-moderately reduced systolic function, with an ejection  fraction of 40-45%. The cavity size was normal. Left ventricular diastolic Doppler parameters are consistent with impaired relaxation.  2. Severe akinesis of the left ventricular apical segment.  3. Moderate hypokinesis of the left ventricular anteroseptal wall.  4. The right ventricle has normal systolic function. The cavity was normal. There is no increase in right ventricular wall thickness.  5. The tricuspid valve is normal in structure.  6. The pulmonic valve was normal in structure.  7. The aortic valve is normal in structure.  8. The mitral valve is normal in structure. _____________     ASSESSMENT:    1. Coronary artery disease involving native coronary artery of native heart with unstable angina pectoris (HCC)   2. Ischemic cardiomyopathy   3. Chronic systolic CHF (congestive heart failure) (HCC)   4. Essential hypertension   5. Mixed hyperlipidemia      PLAN:  In order of problems listed above:   CAD status post STEMI treated with DES to the LAD 01/04/2019 on aspirin and Brilinta.  Continue current treatment.  Patient does have some nausea and hoarseness.  Will start Protonix 20 mg daily.  Wants to return to work on light duty next week.  Write him a note.  Ischemic cardiomyopathy ejection fraction 40 to 45% on lisinopril and metoprolol no heart failure on exam.  Will need follow-up echo in 3 to 4 months  Chronic systolic CHF ejection fraction 40 to 45%.  Will repeat echo in 3 to 4 months  Hypertension blood pressure well controlled  Hyperlipidemia LDL 196 on 01/04/2019 on Lipitor check fasting lipid panel and LFTs in 6 weeks.  Prediabetes on metformin hemoglobin A1c 6.3-could be contributing to his nausea.   Medication Adjustments/Labs and Tests Ordered: Current medicines are reviewed at length with the patient today.  Concerns regarding medicines are outlined above.  Medication changes, Labs and Tests ordered today are listed in the Patient Instructions below.  Patient Instructions  Medication Instructions:  1) START PROTONIX 40 mg daily  Labwork: Come FASTING (no food after midnight) to your appointment with Dr. Eldridge Dace.  Testing/Procedures: Your provider has requested  that you have an echocardiogram in 3 months. Echocardiography is a painless test that uses sound waves to create images of your heart. It provides your doctor with information about the size and shape of your heart and how well your heart's chambers and valves are working. This procedure takes approximately one hour. There are no restrictions for this procedure. You are scheduled for your echo 04/23/2019 at 11:30AM. Please arrive by 11:00AM.  Follow-Up: You have an appointment with Dr. Eldridge DaceVaranasi on 04/08/2019 at 9:40AM. Remember to come fasting as we will check your cholesterol!    Elson ClanSigned, Diem Pagnotta, PA-C  01/21/2019 12:55 PM    [facility] [address], [place], [place]  [address] Phone: [phone]; Fax: [phone]

## 2019-01-28 ENCOUNTER — Telehealth (HOSPITAL_COMMUNITY): Payer: Self-pay

## 2019-01-28 NOTE — Telephone Encounter (Signed)
Called patient to see if he was interested in participating in the Cardiac Rehab Program. Patient stated not at this time.  Closed referral 

## 2019-02-28 ENCOUNTER — Other Ambulatory Visit: Payer: Self-pay | Admitting: Cardiology

## 2019-03-04 ENCOUNTER — Telehealth: Payer: Self-pay | Admitting: *Deleted

## 2019-03-04 ENCOUNTER — Other Ambulatory Visit: Payer: Self-pay | Admitting: Cardiology

## 2019-03-04 NOTE — Telephone Encounter (Signed)
[facility] pharmacy requesting refill for Vascepa per epic note patient was to be on only for 30 days. Please advise. Thank you

## 2019-03-05 ENCOUNTER — Other Ambulatory Visit: Payer: Self-pay | Admitting: Physician Assistant

## 2019-03-05 MED ORDER — ICOSAPENT ETHYL 1 G PO CAPS: 2.0000 g | ORAL_CAPSULE | Freq: Two times a day (BID) | ORAL | 2 refills | Status: DC

## 2019-03-05 NOTE — Telephone Encounter (Signed)
Pt's medication was sent to pt's pharmacy as requested. Confirmation received.  °

## 2019-03-06 NOTE — Telephone Encounter (Signed)
OK to refill Vascepa

## 2019-03-06 NOTE — Telephone Encounter (Signed)
Refill has already been sent in. 

## 2019-03-06 NOTE — Addendum Note (Signed)
Addended by: Daleen Bo I on: 03/06/2019 12:04 PM   Modules accepted: Orders

## 2019-03-10 ENCOUNTER — Other Ambulatory Visit: Payer: Self-pay | Admitting: Cardiology

## 2019-03-28 ENCOUNTER — Other Ambulatory Visit: Payer: Self-pay | Admitting: Cardiology

## 2019-04-02 ENCOUNTER — Telehealth: Payer: Self-pay

## 2019-04-02 NOTE — Telephone Encounter (Signed)
Virtual Visit Pre-Appointment Phone Call  "(Name), I am calling you today to discuss your upcoming appointment. We are currently trying to limit exposure to the virus that causes COVID-19 by seeing patients at home rather than in the office."  1. "What is the BEST phone number to call the day of the visit?" - include this in appointment notes  2. "Do you have or have access to (through a family member/friend) a smartphone with video capability that we can use for your visit?" a. If yes - list this number in appt notes as "cell" (if different from BEST phone #) and list the appointment type as a VIDEO visit in appointment notes b. If no - list the appointment type as a PHONE visit in appointment notes  3. Confirm consent - "In the setting of the current Covid19 crisis, you are scheduled for a (phone or video) visit with your provider on (date) at (time).  Just as we do with many in-office visits, in order for you to participate in this visit, we must obtain consent.  If you'd like, I can send this to your mychart (if signed up) or email for you to review.  Otherwise, I can obtain your verbal consent now.  All virtual visits are billed to your insurance company just like a normal visit would be.  By agreeing to a virtual visit, we'd like you to understand that the technology does not allow for your provider to perform an examination, and thus may limit your provider's ability to fully assess your condition. If your provider identifies any concerns that need to be evaluated in person, we will make arrangements to do so.  Finally, though the technology is pretty good, we cannot assure that it will always work on either your or our end, and in the setting of a video visit, we may have to convert it to a phone-only visit.  In either situation, we cannot ensure that we have a secure connection.  Are you willing to proceed?" STAFF: Did the patient verbally acknowledge consent to telehealth visit? yes  4.  Advise patient to be prepared - "Two hours prior to your appointment, go ahead and check your blood pressure, pulse, oxygen saturation, and your weight (if you have the equipment to check those) and write them all down. When your visit starts, your provider will ask you for this information. If you have an Apple Watch or Kardia device, please plan to have heart rate information ready on the day of your appointment. Please have a pen and paper handy nearby the day of the visit as well."  5. Give patient instructions for MyChart download to smartphone OR Doximity/Doxy.me as below if video visit (depending on what platform provider is using)  6. Inform patient they will receive a phone call 15 minutes prior to their appointment time (may be from unknown caller ID) so they should be prepared to answer    TELEPHONE CALL NOTE  Mark Cole has been deemed a candidate for a follow-up tele-health visit to limit community exposure during the Covid-19 pandemic. I spoke with the patient via phone to ensure availability of phone/video source, confirm preferred email & phone number, and discuss instructions and expectations.  I reminded Mark Cole to be prepared with any vital sign and/or heart rhythm information that could potentially be obtained via home monitoring, at the time of his visit. I reminded Mark Cole to expect a phone call prior to his visit.  Mark Cole,  CMA 04/02/2019 3:47 PM   INSTRUCTIONS FOR DOWNLOADING THE MYCHART APP TO SMARTPHONE  - The patient must first make sure to have activated MyChart and know their login information - If Apple, go to [employer] and type in MyChart in the search bar and download the app. If Android, ask patient to go to [employer] and type in [employer] in the search bar and download the app. The app is free but as with any other app downloads, their phone may require them to verify saved payment information or Apple/Android password.  - The patient will need  to then log into the app with their MyChart username and password, and select [facility] as their healthcare provider to link the account. When it is time for your visit, go to the MyChart app, find appointments, and click Begin Video Visit. Be sure to Select Allow for your device to access the Microphone and Camera for your visit. You will then be connected, and your provider will be with you shortly.  **If they have any issues connecting, or need assistance please contact MyChart service desk [phone])**  **If using a computer, in order to ensure the best quality for their visit they will need to use either of the following Internet Browsers: [employer] or Google Chrome**  IF USING DOXIMITY or DOXY.ME - The patient will receive a link just prior to their visit by text.     FULL LENGTH CONSENT FOR TELE-HEALTH VISIT   I hereby voluntarily request, consent and authorize [facility] and its employed or contracted physicians, physician assistants, nurse practitioners or other licensed health care professionals (the Practitioner), to provide me with telemedicine health care services (the "Services") as deemed necessary by the treating Practitioner. I acknowledge and consent to receive the Services by the Practitioner via telemedicine. I understand that the telemedicine visit will involve communicating with the Practitioner through live audiovisual communication technology and the disclosure of certain medical information by electronic transmission. I acknowledge that I have been given the opportunity to request an in-person assessment or other available alternative prior to the telemedicine visit and am voluntarily participating in the telemedicine visit.  I understand that I have the right to withhold or withdraw my consent to the use of telemedicine in the course of my care at any time, without affecting my right to future care or treatment, and that the Practitioner or I may  terminate the telemedicine visit at any time. I understand that I have the right to inspect all information obtained and/or recorded in the course of the telemedicine visit and may receive copies of available information for a reasonable fee.  I understand that some of the potential risks of receiving the Services via telemedicine include:  Marland Kitchen Delay or interruption in medical evaluation due to technological equipment failure or disruption; . Information transmitted may not be sufficient (e.g. poor resolution of images) to allow for appropriate medical decision making by the Practitioner; and/or  . In rare instances, security protocols could fail, causing a breach of personal health information.  Furthermore, I acknowledge that it is my responsibility to provide information about my medical history, conditions and care that is complete and accurate to the best of my ability. I acknowledge that Practitioner's advice, recommendations, and/or decision may be based on factors not within their control, such as incomplete or inaccurate data provided by me or distortions of diagnostic images or specimens that may result from electronic transmissions. I understand that the practice of  medicine is not an Visual merchandiser and that Practitioner makes no warranties or guarantees regarding treatment outcomes. I acknowledge that I will receive a copy of this consent concurrently upon execution via email to the email address I last provided but may also request a printed copy by calling the office of [facility].    I understand that my insurance will be billed for this visit.   I have read or had this consent read to me. . I understand the contents of this consent, which adequately explains the benefits and risks of the Services being provided via telemedicine.  . I have been provided ample opportunity to ask questions regarding this consent and the Services and have had my questions answered to my satisfaction. . I  give my informed consent for the services to be provided through the use of telemedicine in my medical care  By participating in this telemedicine visit I agree to the above.

## 2019-04-02 NOTE — Telephone Encounter (Signed)
Pt called back said yes to virtual visit on same day and time

## 2019-04-02 NOTE — Telephone Encounter (Signed)
Pt give pt a call back.  Returned your call

## 2019-04-02 NOTE — Telephone Encounter (Signed)
Called pt to set up possible evisit. Left message asking pt to call the office.  

## 2019-04-07 NOTE — Progress Notes (Signed)
Virtual Visit via Video Note   This visit type was conducted due to national recommendations for restrictions regarding the COVID-19 Pandemic (e.g. social distancing) in an effort to limit this patient's exposure and mitigate transmission in our community.  Due to his co-morbid illnesses, this patient is at least at moderate risk for complications without adequate follow up.  This format is felt to be most appropriate for this patient at this time.  All issues noted in this document were discussed and addressed.  A limited physical exam was performed with this format.  Please refer to the patient's chart for his consent to telehealth for [facility].   Patient could not access camera.  Date:  04/08/2019   ID:  Mark Cole, DOB [id number], MRN [id number]  Patient Location: Home Provider Location: Home  PCP:  Patient, No Pcp Per  Cardiologist:  Lance Muss, MD  Electrophysiologist:  None   Evaluation Performed:  Follow-Up Visit  Chief Complaint:  CAD  History of Present Illness:    Mark Cole is a 53 y.o. male with CAD.  He is a male [place] patient that speaks some English with no prior cardiac history was admitted with STEMI with DES to the LAD 01/04/19 peak troponin V 0.73 sent home on Brilinta and aspirin.  LV EF 40 to 45% on lisinopril metoprolol, prediabetes on metformin and hyperlipidemia with LDL of 196 started on high-dose statin.  He had nausea post MI.  Denies : Chest pain. Dizziness. Leg edema. Nitroglycerin use. Orthopnea. Palpitations. Paroxysmal nocturnal dyspnea. Shortness of breath. Syncope.   Walks a lot at work. No problems with exertion.   The patient does not have symptoms concerning for COVID-19 infection (fever, chills, cough, or new shortness of breath).    Past Medical History:  Diagnosis Date  . Asthma   . CAD (coronary artery disease) 01/06/2019  . Diabetes (HCC)   . Hyperlipidemia   . 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 Reactive airway disease  11/14/2013  . STEMI (ST elevation myocardial infarction) (HCC)   . Type 2 diabetes mellitus with complication, with long-term current use of insulin (HCC) 01/06/2019   Past Surgical History:  Procedure Laterality Date  . CORONARY/GRAFT ACUTE MI REVASCULARIZATION N/A 01/04/2019   Procedure: Coronary/Graft Acute MI Revascularization;  Surgeon: Yvonne Kendall, MD;  Location: [facility] INVASIVE CV [facility];  Service: Cardiovascular;  Laterality: N/A;  . LEFT HEART CATH AND CORONARY ANGIOGRAPHY N/A 01/04/2019   Procedure: LEFT HEART CATH AND CORONARY ANGIOGRAPHY;  Surgeon: Yvonne Kendall, MD;  Location: [facility] INVASIVE CV LAB;  Service: Cardiovascular;  Laterality: N/A;     Current Meds  Medication Sig  . albuterol (PROAIR HFA) 108 (90 Base) MCG/ACT inhaler Inhale 2 puffs into the lungs every 4 (four) hours as needed for wheezing or shortness of breath.  Marland Kitchen aspirin EC 81 MG EC tablet Take 1 tablet (81 mg total) by mouth daily.  Marland Kitchen atorvastatin (LIPITOR) 80 MG tablet TAKE 1 TABLET EVERY DAY AT 6 PM  . budesonide-formoterol (SYMBICORT) 160-4.5 MCG/ACT inhaler Inhale 2 puffs into the lungs 2 (two) times daily.  Bess Harvest Ethyl (VASCEPA) 1 g CAPS Take 2 capsules (2 g total) by mouth 2 (two) times daily.  Marland Kitchen lisinopril (ZESTRIL) 5 MG tablet TAKE 1 TABLET BY MOUTH EVERY DAY  . metFORMIN (GLUCOPHAGE) 500 MG tablet TAKE 1 TABLET BY MOUTH TWICE A DAY WITH A MEAL  . metoprolol succinate (TOPROL-XL) 25 MG 24 hr tablet TAKE 1 TABLET AT BEDTIME  . nitroGLYCERIN (NITROSTAT) 0.4  MG SL tablet PLACE 1 TABLET UNDER TONGUE EVERY 5 MINUTES FOR 3 DOSES AS NEEDED FOR CHEST PAIN  . pantoprazole (PROTONIX) 40 MG tablet Take 1 tablet (40 mg total) by mouth daily.  . ticagrelor (BRILINTA) 90 MG TABS tablet Take 1 tablet (90 mg total) by mouth 2 (two) times daily.     Allergies:   Patient has no known allergies.   Social History   Tobacco Use  . Smoking status: Former Smoker    Types: Cigarettes    Last attempt to quit: 12/07/2010     Years since quitting: 8.3  . Smokeless tobacco: Never Used  Substance Use Topics  . Alcohol use: No    Alcohol/week: 0.0 standard drinks  . Drug use: No     Family Hx: The patient's family history is not on file.  ROS:   Please see the history of present illness.     All other systems reviewed and are negative.   Prior CV studies:   The following studies were reviewed today:  Cath results reviewed  Labs/Other Tests and Data Reviewed:    EKG:  An ECG dated Feb 2020 was personally reviewed today and demonstrated:  NSR, anterior T wave inversion  Recent Labs: 01/05/2019: BUN 14; Creatinine, Ser 0.84; Hemoglobin 14.4; Platelets 397; Potassium 3.8; Sodium 135   Recent Lipid Panel Lab Results  Component Value Date/Time   CHOL 294 (H) 01/04/2019 05:02 AM   TRIG 222 (H) 01/04/2019 05:02 AM   HDL 54 01/04/2019 05:02 AM   CHOLHDL 5.4 01/04/2019 05:02 AM   LDLCALC 196 (H) 01/04/2019 05:02 AM    Wt Readings from Last 3 Encounters:  04/08/19 137 lb (62.1 kg)  01/21/19 142 lb 12.8 oz (64.8 kg)  01/06/19 143 lb 4.8 oz (65 kg)     Objective:    Vital Signs:  Ht 5' (1.524 m)   Wt 137 lb (62.1 kg)   BMI 26.76 kg/m    VITAL SIGNS:  reviewed RESPIRATORY:  no shortness of breath PSYCH:  unable to assess exam limited by phone format  ASSESSMENT & PLAN:    1. CAD: Stop aspirin due bruising.  COntinue Brilinta.  No angina. COntiue aggressive secondary prevention.   2. Ischemic cardiomyopathy: No CHF sx.   3. Chronic systolic heart failure: Well compensated.   4. HTN: Not checking his BP at home.   5. Hyperlipidemia: COntinue lipid lowering meds.  LDL 196 at time of MI.  Needs recheck.  6. Prediabetes: On metformin to prevent DM. A1C was 6.3 at last check.  Could be cause of nausea.  If nausea not better in a week off of aspirin, would decrease metformin to once a day.  could then recheck A1C a few months later.   COVID-19 Education: The signs and symptoms of COVID-19 were  discussed with the patient and how to seek care for testing (follow up with PCP or arrange E-visit).  The importance of social distancing was discussed today.  Time:   Today, I have spent 15 minutes with the patient with telehealth technology discussing the above problems.     Medication Adjustments/Labs and Tests Ordered: Current medicines are reviewed at length with the patient today.  Concerns regarding medicines are outlined above.   Tests Ordered: No orders of the defined types were placed in this encounter.   Medication Changes: No orders of the defined types were placed in this encounter.   Disposition:  Follow up in 4 month(s)  Signed, Lance Muss,  MD  04/08/2019 10:34 AM    [facility]

## 2019-04-08 ENCOUNTER — Encounter: Payer: Self-pay | Admitting: Interventional Cardiology

## 2019-04-08 ENCOUNTER — Other Ambulatory Visit: Payer: BLUE CROSS/BLUE SHIELD | Admitting: *Deleted

## 2019-04-08 ENCOUNTER — Telehealth (INDEPENDENT_AMBULATORY_CARE_PROVIDER_SITE_OTHER): Payer: BLUE CROSS/BLUE SHIELD | Admitting: Interventional Cardiology

## 2019-04-08 ENCOUNTER — Other Ambulatory Visit: Payer: Self-pay

## 2019-04-08 VITALS — Ht 60.0 in | Wt 137.0 lb

## 2019-04-08 DIAGNOSIS — I10 Essential (primary) hypertension: Secondary | ICD-10-CM

## 2019-04-08 DIAGNOSIS — I25110 Atherosclerotic heart disease of native coronary artery with unstable angina pectoris: Secondary | ICD-10-CM | POA: Diagnosis not present

## 2019-04-08 DIAGNOSIS — I5022 Chronic systolic (congestive) heart failure: Secondary | ICD-10-CM | POA: Diagnosis not present

## 2019-04-08 DIAGNOSIS — E782 Mixed hyperlipidemia: Secondary | ICD-10-CM | POA: Diagnosis not present

## 2019-04-08 DIAGNOSIS — I255 Ischemic cardiomyopathy: Secondary | ICD-10-CM

## 2019-04-08 LAB — HEPATIC FUNCTION PANEL
ALT: 25 IU/L (ref 0–44)
AST: 20 IU/L (ref 0–40)
Albumin: 4.2 g/dL (ref 3.8–4.9)
Alkaline Phosphatase: 55 IU/L (ref 39–117)
Bilirubin Total: 0.6 mg/dL (ref 0.0–1.2)
Bilirubin, Direct: 0.19 mg/dL (ref 0.00–0.40)
Total Protein: 6.2 g/dL (ref 6.0–8.5)

## 2019-04-08 LAB — LIPID PANEL
Chol/HDL Ratio: 2.2 ratio (ref 0.0–5.0)
Cholesterol, Total: 85 mg/dL — ABNORMAL LOW (ref 100–199)
HDL: 39 mg/dL — ABNORMAL LOW (ref >39)
LDL Calculated: 29 mg/dL (ref 0–99)
Triglycerides: 87 mg/dL (ref 0–149)
VLDL Cholesterol Cal: 17 mg/dL (ref 5–40)

## 2019-04-08 NOTE — Patient Instructions (Addendum)
Medication Instructions:  Your physician has recommended you make the following change in your medication:   STOP: Aspirin  Lab work: None Ordered  If you have labs (blood work) drawn today and your tests are completely normal, you will receive your results only by: Marland Kitchen MyChart Message (if you have MyChart) OR . A paper copy in the mail If you have any lab test that is abnormal or we need to change your treatment, we will call you to review the results.  Testing/Procedures: None ordered  Follow-Up: Follow up with Dr. Eldridge Dace on 07/31/19 at 9:00 AM  Any Other Special Instructions Will Be Listed Below (If Applicable).  Let us know if your nausea does not improve

## 2019-04-13 ENCOUNTER — Telehealth: Payer: Self-pay | Admitting: Interventional Cardiology

## 2019-04-13 NOTE — Telephone Encounter (Signed)
Left message for patient to call back  

## 2019-04-13 NOTE — Telephone Encounter (Signed)
-----   Message from Dyann Kief, PA-C sent at 04/08/2019  4:23 PM EDT ----- Excellent response to statin. LDL below goal.continue treatment

## 2019-04-13 NOTE — Telephone Encounter (Signed)
The patient has been notified of the result and verbalized understanding.  All questions (if any) were answered. Patient denies having any nausea since stopping ASA.  Lattie Haw, RN 04/13/2019 10:57 AM

## 2019-04-13 NOTE — Telephone Encounter (Signed)
Follow Up:    Pt would like for you to mail his lab results please. He says he can not understand English that good.

## 2019-04-22 ENCOUNTER — Telehealth (HOSPITAL_COMMUNITY): Payer: Self-pay | Admitting: Radiology

## 2019-04-22 NOTE — Telephone Encounter (Signed)

## 2019-04-23 ENCOUNTER — Ambulatory Visit (HOSPITAL_COMMUNITY): Payer: BLUE CROSS/BLUE SHIELD | Attending: Cardiovascular Disease

## 2019-04-23 ENCOUNTER — Other Ambulatory Visit: Payer: Self-pay

## 2019-04-23 DIAGNOSIS — I255 Ischemic cardiomyopathy: Secondary | ICD-10-CM | POA: Diagnosis present

## 2019-05-25 ENCOUNTER — Other Ambulatory Visit: Payer: Self-pay | Admitting: Cardiology

## 2019-06-11 ENCOUNTER — Telehealth: Payer: Self-pay | Admitting: Interventional Cardiology

## 2019-06-11 NOTE — Telephone Encounter (Signed)
   [facility] Pre-operative Risk Assessment    Request for surgical clearance:  1. What type of surgery is being performed?  Colonoscopy   2. When is this surgery scheduled? TBD   3. What type of clearance is required (medical clearance vs. Pharmacy clearance to hold med vs. Both)? Both  4. Are there any medications that need to be held prior to surgery and how long? Brilinta x 5 days   5. Practice name and name of physician performing surgery? [facility]   6. What is your office phone number? [phone]    7.   What is your office fax number? [phone]  8.   Anesthesia type (None, local, MAC, general) ? Not listed  _________________________________________________________________   (provider comments below)

## 2019-06-12 NOTE — Telephone Encounter (Signed)
  Chart reviewed. Pt is s/p STEMI 12/2018 w/ DES placement to the LAD. Recommendations are to treat with DAPT w/o interruption x 12 months. If elective colonoscopy, will need to delay until after 12/2019. Will check with requesting provider to see if study can be delayed. If colonoscopy cannot be delayed, we will need to seek clearance from primary cardiologist.

## 2019-06-12 NOTE — Telephone Encounter (Signed)
Lvm with procedure scheduling to contact back to let us know if patient can have procedure done next year 2/201

## 2019-06-16 NOTE — Telephone Encounter (Signed)
   Primary Cardiologist: Larae Grooms, MD  Chart reviewed as part of pre-operative protocol coverage. Patient was contacted 06/16/2019 in reference to pre-operative risk assessment for pending surgery as outlined below.  Keevin Gullion was last seen on 04/08/19 by Dr. Beau Fanny .   Tarrance Garate has CAD with acute MI 01/04/19 and is on Brilinta and ASA,  She should not come off her Brilinta or ASA until 01/05/2020.   So do not recommend procedure at this time.   If this needs to be done due to bleeding or other issues please contact our office.  [place], NP 06/16/2019, 9:36 AM

## 2019-06-18 NOTE — Telephone Encounter (Signed)
   Spoke to patient's wife, Blanch Media, and clarified reason for delaying the routine screening colonoscopy. All questions were answered and I informed her that our recommendation was faxed to [facility] on 06/16/2019.   Abigail Butts, PA-C 06/18/19; 1:40 PM

## 2019-06-18 NOTE — Telephone Encounter (Signed)
  Sister in law is calling to find out what the patient needs to do. Patient did not understand what to do.

## 2019-07-17 ENCOUNTER — Other Ambulatory Visit: Payer: Self-pay | Admitting: Interventional Cardiology

## 2019-07-18 ENCOUNTER — Other Ambulatory Visit: Payer: Self-pay | Admitting: Cardiology

## 2019-07-29 NOTE — Progress Notes (Deleted)
Cardiology Office Note   Date:  07/29/2019   ID:  Mark Cole, DOB 01/16/1966, MRN [id number]  PCP:  Lenord Fellershurch, Shea N, PA-C    No chief complaint on file.    Wt Readings from Last 3 Encounters:  04/08/19 137 lb (62.1 kg)  01/21/19 142 lb 12.8 oz (64.8 kg)  01/06/19 143 lb 4.8 oz (65 kg)       History of Present Illness: Mark Cole is a 53 y.o. male  with CAD.  He is a maleVietnamese patient that speaks some English with no prior cardiac history was admitted with STEMI with DES to the LAD2/23/20 peak troponin V 0.73 sent home on Brilinta and aspirin. LV EF 40 to 45% on lisinopril metoprolol, prediabetes on metformin and hyperlipidemia with LDL of 196 started on high-dose statin.  He had nausea post MI.  In 12/2018, plan was: "On metformin to prevent DM. A1C was 6.3 at last check.  Could be cause of nausea.  If nausea not better in a week off of aspirin, would decrease metformin to once a day.  could then recheck A1C a few months later."   Still walked a lot post MI.    Past Medical History:  Diagnosis Date  . Asthma   . CAD (coronary artery disease) 01/06/2019  . Diabetes (HCC)   . Hyperlipidemia   . 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 Reactive airway disease 11/14/2013  . STEMI (ST elevation myocardial infarction) (HCC)   . Type 2 diabetes mellitus with complication, with long-term current use of insulin (HCC) 01/06/2019    Past Surgical History:  Procedure Laterality Date  . CORONARY/GRAFT ACUTE MI REVASCULARIZATION N/A 01/04/2019   Procedure: Coronary/Graft Acute MI Revascularization;  Surgeon: Yvonne KendallEnd, Christopher, MD;  Location: [facility];  Service: Cardiovascular;  Laterality: N/A;  . LEFT HEART CATH AND CORONARY ANGIOGRAPHY N/A 01/04/2019   Procedure: LEFT HEART CATH AND CORONARY ANGIOGRAPHY;  Surgeon: Yvonne KendallEnd, Christopher, MD;  Location: [facility];  Service: Cardiovascular;  Laterality: N/A;     Current Outpatient Medications  Medication Sig Dispense  Refill  . albuterol (PROAIR HFA) 108 (90 Base) MCG/ACT inhaler Inhale 2 puffs into the lungs every 4 (four) hours as needed for wheezing or shortness of breath. 1 Inhaler 5  . atorvastatin (LIPITOR) 80 MG tablet TAKE 1 TABLET EVERY DAY AT 6 PM 60 tablet 0  . budesonide-formoterol (SYMBICORT) 160-4.5 MCG/ACT inhaler Inhale 2 puffs into the lungs 2 (two) times daily. 10.2 Inhaler 5  . Icosapent Ethyl (VASCEPA) 1 g CAPS Take 2 capsules (2 g total) by mouth 2 (two) times daily. 360 capsule 2  . lisinopril (ZESTRIL) 5 MG tablet TAKE 1 TABLET BY MOUTH EVERY DAY 90 tablet 2  . metFORMIN (GLUCOPHAGE) 500 MG tablet TAKE 1 TABLET BY MOUTH TWICE A DAY WITH A MEAL 180 tablet 2  . metoprolol succinate (TOPROL-XL) 25 MG 24 hr tablet TAKE 1 TABLET BY MOUTH EVERYDAY AT BEDTIME 90 tablet 2  . nitroGLYCERIN (NITROSTAT) 0.4 MG SL tablet PLACE 1 TABLET UNDER TONGUE EVERY 5 MINUTES FOR 3 DOSES AS NEEDED FOR CHEST PAIN 25 tablet 0  . pantoprazole (PROTONIX) 40 MG tablet Take 1 tablet (40 mg total) by mouth daily. 30 tablet 11  . ticagrelor (BRILINTA) 90 MG TABS tablet Take 1 tablet (90 mg total) by mouth 2 (two) times daily. 180 tablet 2   No current facility-administered medications for this visit.     Allergies:   Patient has no known  allergies.    Social History:  The patient  reports that he quit smoking about 8 years ago. His smoking use included cigarettes. He has never used smokeless tobacco. He reports that he does not drink alcohol or use drugs.   Family History:  The patient's ***family history is not on file.    ROS:  Please see the history of present illness.   Otherwise, review of systems are positive for ***.   All other systems are reviewed and negative.    PHYSICAL EXAM: VS:  There were no vitals taken for this visit. , BMI There is no height or weight on file to calculate BMI. GEN: Well nourished, well developed, in no acute distress  HEENT: normal  Neck: no JVD, carotid bruits, or masses  Cardiac: ***RRR; no murmurs, rubs, or gallops,no edema  Respiratory:  clear to auscultation bilaterally, normal work of breathing GI: soft, nontender, nondistended, + BS MS: no deformity or atrophy  Skin: warm and dry, no rash Neuro:  Strength and sensation are intact Psych: euthymic mood, full affect   EKG:   The ekg ordered today demonstrates ***   Recent Labs: 01/05/2019: BUN 14; Creatinine, Ser 0.84; Hemoglobin 14.4; Platelets 397; Potassium 3.8; Sodium 135 04/08/2019: ALT 25   Lipid Panel    Component Value Date/Time   CHOL 85 (L) 04/08/2019 0914   TRIG 87 04/08/2019 0914   HDL 39 (L) 04/08/2019 0914   CHOLHDL 2.2 04/08/2019 0914   CHOLHDL 5.4 01/04/2019 0502   VLDL 44 (H) 01/04/2019 0502   LDLCALC 29 04/08/2019 0914     Other studies Reviewed: Additional studies/ records that were reviewed today with results demonstrating: ***.   ASSESSMENT AND PLAN:  1. CAD: Aspirin was stopped due to nausea. 2. Ischemic cardiomyopathy: 3. Chronic systolic heart failure: 4. HTN: 5. Hyperlipidemia: 6. Prediabetes: Was treated with Metformin.   Current medicines are reviewed at length with the patient today.  The patient concerns regarding his medicines were addressed.  The following changes have been made:  No change***  Labs/ tests ordered today include: *** No orders of the defined types were placed in this encounter.   Recommend 150 minutes/week of aerobic exercise Low fat, low carb, high fiber diet recommended  Disposition:   FU in ***   Signed, Larae Grooms, MD  07/29/2019 9:13 AM    [facility] [place], [place], [place]  [address] Phone: [phone]; Fax: [phone]

## 2019-07-31 ENCOUNTER — Ambulatory Visit: Payer: BLUE CROSS/BLUE SHIELD | Admitting: Interventional Cardiology

## 2019-09-10 ENCOUNTER — Other Ambulatory Visit: Payer: Self-pay | Admitting: Cardiology

## 2019-11-16 ENCOUNTER — Ambulatory Visit: Payer: BLUE CROSS/BLUE SHIELD | Attending: Internal Medicine

## 2019-11-16 DIAGNOSIS — Z20822 Contact with and (suspected) exposure to covid-19: Secondary | ICD-10-CM

## 2019-11-17 LAB — NOVEL CORONAVIRUS, NAA: SARS-CoV-2, NAA: DETECTED — AB

## 2019-11-25 ENCOUNTER — Other Ambulatory Visit: Payer: Self-pay | Admitting: Physician Assistant

## 2019-11-25 MED ORDER — ICOSAPENT ETHYL 1 G PO CAPS: 2.0000 g | ORAL_CAPSULE | Freq: Two times a day (BID) | ORAL | 1 refills | Status: AC

## 2019-12-11 ENCOUNTER — Other Ambulatory Visit: Payer: Self-pay | Admitting: Cardiology

## 2020-01-09 ENCOUNTER — Other Ambulatory Visit: Payer: Self-pay | Admitting: Physician Assistant

## 2020-04-07 ENCOUNTER — Other Ambulatory Visit: Payer: Self-pay | Admitting: Cardiology

## 2020-04-07 NOTE — Telephone Encounter (Signed)
Pt's pharmacy is requesting a refill on metformin. Would Dr. Varanasi like to refill this medication? Please address °

## 2020-04-10 NOTE — Telephone Encounter (Signed)
He does need a PCP.  If he does not have one, make sure he has CMet, lipids, A1C scheduled and we can refill it for 90 days while he can get a PCP. Thanks.

## 2020-04-12 NOTE — Telephone Encounter (Signed)
Called and spoke to patient. He states that he will have his labs done at his PCP's office.

## 2020-07-11 IMAGING — DX DG CHEST 2V
2 series · 2 of 2 positions shown · non-contrast
Comparison: None.

CLINICAL DATA: Chest pain

EXAM:
CHEST - 2 VIEW

[chest pa]
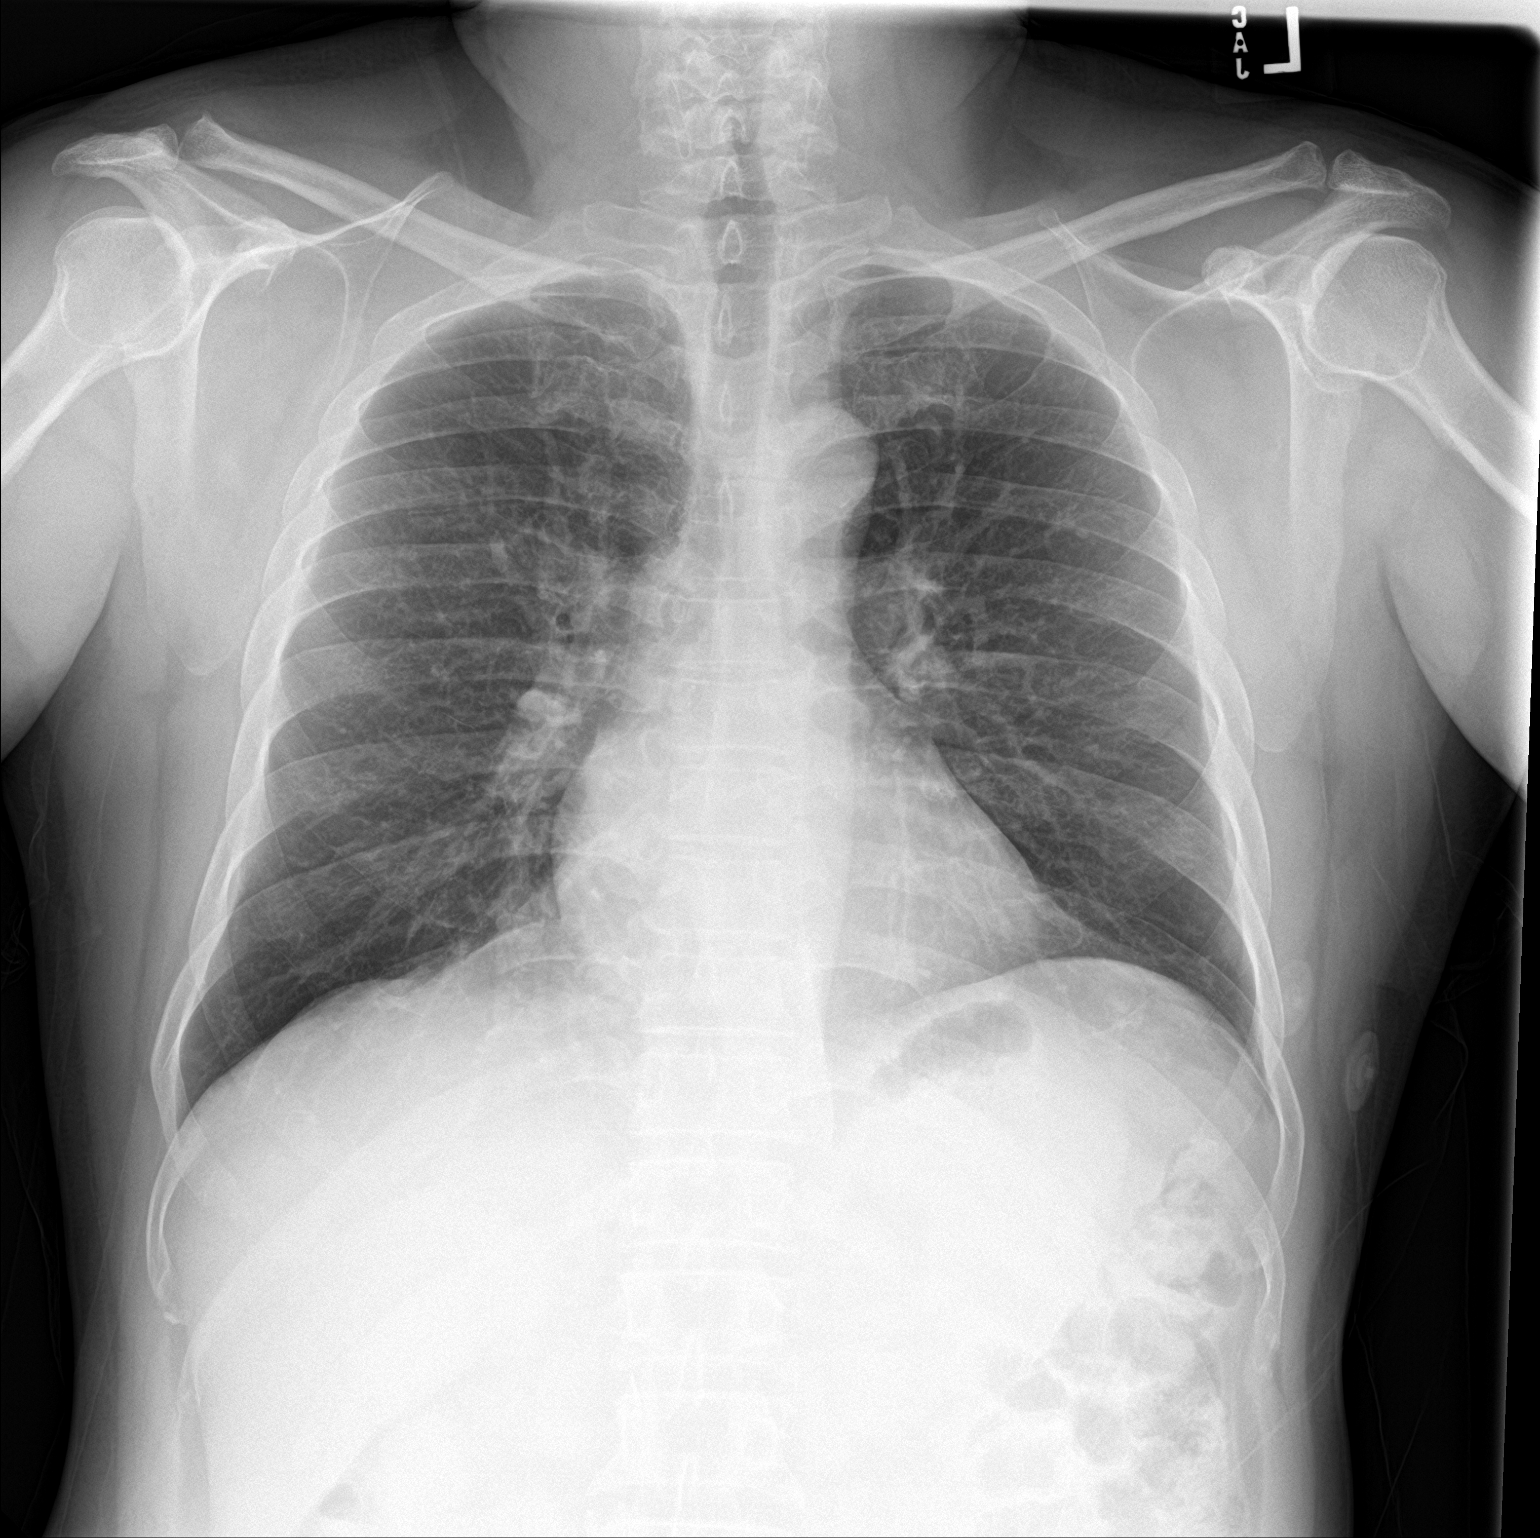

[chest lat]
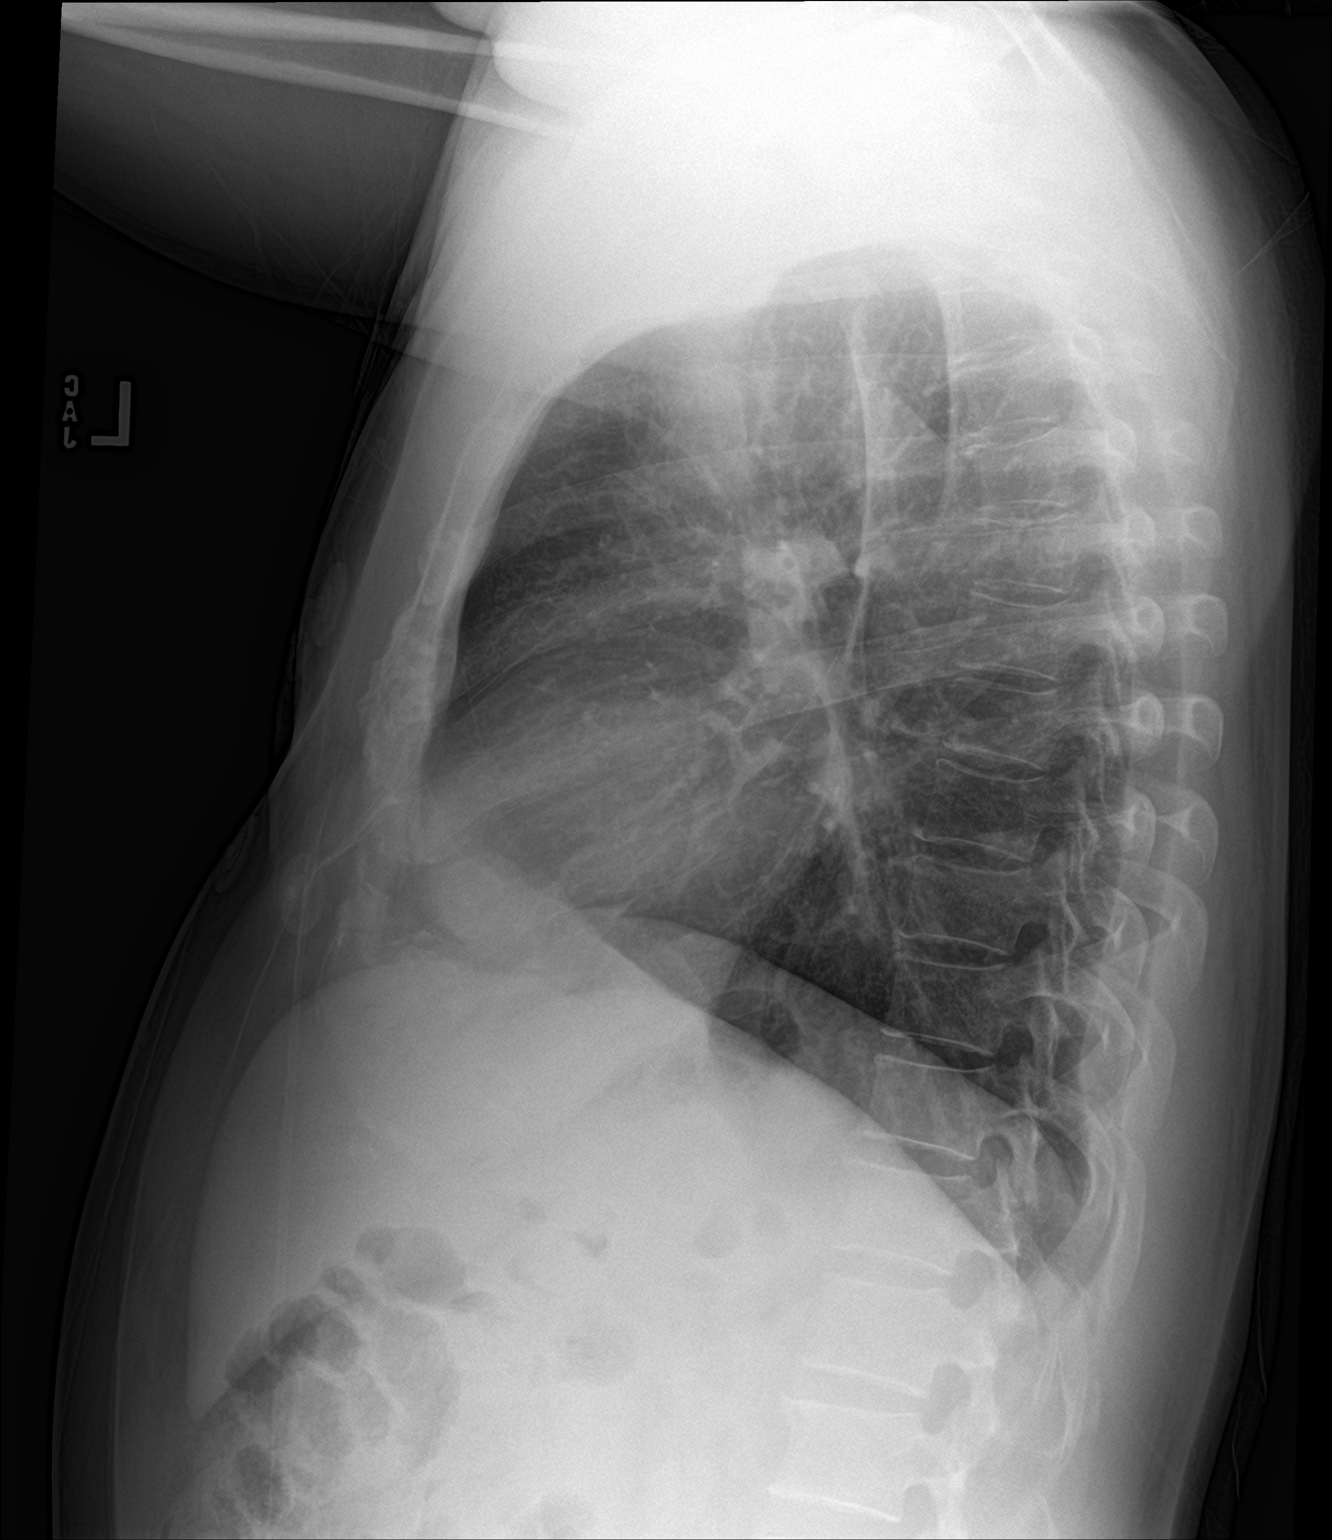

[2 of 2 positions shown; findings below may reference images not displayed]

FINDINGS: The heart size and mediastinal contours are within normal limits.
Both lungs are clear. The visualized skeletal structures are
unremarkable.
IMPRESSION: No active cardiopulmonary disease.

## 2020-09-05 ENCOUNTER — Other Ambulatory Visit: Payer: Self-pay | Admitting: Cardiology

## 2020-10-04 ENCOUNTER — Other Ambulatory Visit: Payer: Self-pay | Admitting: Interventional Cardiology

## 2020-10-12 ENCOUNTER — Other Ambulatory Visit: Payer: Self-pay | Admitting: Interventional Cardiology

## 2020-11-26 ENCOUNTER — Other Ambulatory Visit: Payer: Self-pay | Admitting: Interventional Cardiology

## 2020-12-10 ENCOUNTER — Other Ambulatory Visit: Payer: Self-pay | Admitting: Interventional Cardiology

## 2020-12-12 ENCOUNTER — Other Ambulatory Visit: Payer: Self-pay | Admitting: Interventional Cardiology

## 2020-12-21 ENCOUNTER — Other Ambulatory Visit: Payer: Self-pay | Admitting: Interventional Cardiology

## 2020-12-21 NOTE — Telephone Encounter (Signed)
Pt's pharmacy is requesting a refill on metformin. Would Dr. Varanasi like to refill this medication? Please address °

## 2020-12-29 NOTE — Telephone Encounter (Signed)
Please send to patients PCP

## 2021-02-28 ENCOUNTER — Other Ambulatory Visit: Payer: Self-pay | Admitting: Interventional Cardiology

## 2023-02-12 ENCOUNTER — Other Ambulatory Visit: Payer: Self-pay

## 2023-02-12 ENCOUNTER — Encounter (HOSPITAL_BASED_OUTPATIENT_CLINIC_OR_DEPARTMENT_OTHER): Payer: Self-pay

## 2023-02-12 ENCOUNTER — Emergency Department (HOSPITAL_BASED_OUTPATIENT_CLINIC_OR_DEPARTMENT_OTHER)
Admission: EM | Admit: 2023-02-12 | Discharge: 2023-02-12 | Disposition: A | Discharge status: Home / Self Care | Payer: BLUE CROSS/BLUE SHIELD | Attending: Emergency Medicine | Admitting: Emergency Medicine

## 2023-02-12 ENCOUNTER — Emergency Department (HOSPITAL_BASED_OUTPATIENT_CLINIC_OR_DEPARTMENT_OTHER): Payer: BLUE CROSS/BLUE SHIELD | Admitting: Radiology

## 2023-02-12 ENCOUNTER — Emergency Department (HOSPITAL_BASED_OUTPATIENT_CLINIC_OR_DEPARTMENT_OTHER): Payer: BLUE CROSS/BLUE SHIELD

## 2023-02-12 DIAGNOSIS — Y92410 Unspecified street and highway as the place of occurrence of the external cause: Secondary | ICD-10-CM | POA: Diagnosis not present

## 2023-02-12 DIAGNOSIS — R42 Dizziness and giddiness: Secondary | ICD-10-CM | POA: Diagnosis not present

## 2023-02-12 DIAGNOSIS — R519 Headache, unspecified: Secondary | ICD-10-CM | POA: Insufficient documentation

## 2023-02-12 DIAGNOSIS — R0789 Other chest pain: Secondary | ICD-10-CM | POA: Insufficient documentation

## 2023-02-12 MED ORDER — ONDANSETRON HCL 4 MG PO TABS: 4.0000 mg | ORAL_TABLET | Freq: Four times a day (QID) | ORAL | 0 refills | Status: AC

## 2023-02-12 MED ORDER — ACETAMINOPHEN 500 MG PO TABS: 500.0000 mg | ORAL_TABLET | Freq: Four times a day (QID) | ORAL | 0 refills | Status: AC | PRN

## 2023-02-12 MED ORDER — IBUPROFEN 600 MG PO TABS: 600.0000 mg | ORAL_TABLET | Freq: Four times a day (QID) | ORAL | 0 refills | Status: AC | PRN

## 2023-02-12 MED ORDER — METHOCARBAMOL 500 MG PO TABS: 500.0000 mg | ORAL_TABLET | Freq: Two times a day (BID) | ORAL | 0 refills | Status: AC
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
## 2023-02-12 NOTE — ED Notes (Signed)
Pt discharged to home. Discharge instructions have been discussed with patient and/or family members. Pt verbally acknowledges understanding d/c instructions, and endorses comprehension to checkout at registration before leaving.  °
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
## 2023-07-28 ENCOUNTER — Encounter (HOSPITAL_COMMUNITY): Payer: Self-pay

## 2023-07-28 ENCOUNTER — Emergency Department (HOSPITAL_COMMUNITY): Payer: BLUE CROSS/BLUE SHIELD

## 2023-07-28 ENCOUNTER — Other Ambulatory Visit: Payer: Self-pay

## 2023-07-28 ENCOUNTER — Emergency Department (HOSPITAL_COMMUNITY)
Admission: EM | Admit: 2023-07-28 | Discharge: 2023-07-29 | Disposition: A | Discharge status: Home / Self Care | Payer: BLUE CROSS/BLUE SHIELD | Attending: Emergency Medicine | Admitting: Emergency Medicine

## 2023-07-28 DIAGNOSIS — I10 Essential (primary) hypertension: Secondary | ICD-10-CM | POA: Insufficient documentation

## 2023-07-28 DIAGNOSIS — I2510 Atherosclerotic heart disease of native coronary artery without angina pectoris: Secondary | ICD-10-CM | POA: Insufficient documentation

## 2023-07-28 DIAGNOSIS — R091 Pleurisy: Secondary | ICD-10-CM | POA: Insufficient documentation

## 2023-07-28 DIAGNOSIS — R079 Chest pain, unspecified: Secondary | ICD-10-CM | POA: Diagnosis present

## 2023-07-28 DIAGNOSIS — Z7984 Long term (current) use of oral hypoglycemic drugs: Secondary | ICD-10-CM | POA: Diagnosis not present

## 2023-07-28 DIAGNOSIS — Z1152 Encounter for screening for COVID-19: Secondary | ICD-10-CM | POA: Insufficient documentation

## 2023-07-28 DIAGNOSIS — M545 Low back pain, unspecified: Secondary | ICD-10-CM | POA: Insufficient documentation

## 2023-07-28 DIAGNOSIS — Z7982 Long term (current) use of aspirin: Secondary | ICD-10-CM | POA: Diagnosis not present

## 2023-07-28 LAB — CBC
HCT: 40.1 % (ref 39.0–52.0)
Hemoglobin: 12.8 g/dL — ABNORMAL LOW (ref 13.0–17.0)
MCH: 24.3 pg — ABNORMAL LOW (ref 26.0–34.0)
MCHC: 31.9 g/dL (ref 30.0–36.0)
MCV: 76.2 fL — ABNORMAL LOW (ref 80.0–100.0)
Platelets: 294 10*3/uL (ref 150–400)
RBC: 5.26 MIL/uL (ref 4.22–5.81)
RDW: 14.2 % (ref 11.5–15.5)
WBC: 6.7 10*3/uL (ref 4.0–10.5)
nRBC: 0 % (ref 0.0–0.2)

## 2023-07-28 LAB — TROPONIN I (HIGH SENSITIVITY)
Troponin I (High Sensitivity): 4 ng/L (ref <18)
Troponin I (High Sensitivity): 5 ng/L (ref <18)

## 2023-07-28 LAB — BASIC METABOLIC PANEL
Anion gap: 12 (ref 5–15)
BUN: 16 mg/dL (ref 6–20)
CO2: 25 mmol/L (ref 22–32)
Calcium: 9 mg/dL (ref 8.9–10.3)
Chloride: 101 mmol/L (ref 98–111)
Creatinine, Ser: 0.74 mg/dL (ref 0.61–1.24)
GFR, Estimated: >60 mL/min (ref >60)
Glucose, Bld: 170 mg/dL — ABNORMAL HIGH (ref 70–99)
Potassium: 3.8 mmol/L (ref 3.5–5.1)
Sodium: 138 mmol/L (ref 135–145)

## 2023-07-28 LAB — SARS CORONAVIRUS 2 BY RT PCR: SARS Coronavirus 2 by RT PCR: NEGATIVE

## 2023-07-28 MED ORDER — KETOROLAC TROMETHAMINE 15 MG/ML IJ SOLN
15.0000 mg | Freq: Once | INTRAMUSCULAR | Status: AC
  Administered 2023-07-29: 15 mg via INTRAVENOUS
  Filled 2023-07-28: qty 1

## 2023-07-28 NOTE — ED Provider Notes (Signed)
[place] EMERGENCY DEPARTMENT AT [facility] Provider Note   CSN: [id number] Arrival date & time: 07/28/23  1947     History {Add pertinent medical, surgical, social history, OB history to HPI:1} Chief Complaint  Patient presents with   Chest Pain    Mark Cole is a 57 y.o. male.  Patient with a history of CAD with stents on aspirin and Brilinta, hyperlipidemia, hypertension presenting with left-sided chest pain that has been intermittent for the past 5 days.  The pain is to his left upper chest and upper back only with deep breathing.  Pain is there when he tries to take a deep breath and goes away when he tries to breathe normally but is becoming progressively worse.  Does not feel like his previous MI.  Pain radiates to his left upper back and shoulder area.  Some associated shortness of breath.  No cough or fever.  No nausea or vomiting.  No leg pain or leg swelling.  No abdominal pain. Saw his PCP on Friday and was told it could be "inflammation" but is not improving.  Feels more short of breath today and more painful with deep breathing but pain is not exertional.  CAD/STEMI (12/2018) s/p DES to mLAD [2.5x65mm Synergy DES] ([facility]) Ischemic Cardiomyopathy with moderately reduced EF (30%, 2020)    The history is provided by the patient and the spouse.  Chest Pain Associated symptoms: back pain   Associated symptoms: no abdominal pain, no dizziness, no fever, no nausea, no shortness of breath, no vomiting and no weakness        Home Medications Prior to Admission medications   Medication Sig Start Date End Date Taking? Authorizing Provider  acetaminophen (TYLENOL) 500 MG tablet Take 1 tablet (500 mg total) by mouth every 6 (six) hours as needed. 02/12/23   Redwine, Madison A, PA-C  albuterol (PROAIR HFA) 108 (90 Base) MCG/ACT inhaler Inhale 2 puffs into the lungs every 4 (four) hours as needed for wheezing or shortness of breath. 11/29/18   Shade Flood, MD   atorvastatin (LIPITOR) 80 MG tablet TAKE 1 TABLET EVERY DAY AT 6 PM 05/25/19   Corky Crafts, MD  BRILINTA 90 MG TABS tablet TAKE 1 TABLET BY MOUTH TWICE A DAY 09/10/19   Laverda Page B, NP  budesonide-formoterol [facility]) 160-4.5 MCG/ACT inhaler Inhale 2 puffs into the lungs 2 (two) times daily. 11/29/18   Shade Flood, MD  ibuprofen (ADVIL) 600 MG tablet Take 1 tablet (600 mg total) by mouth every 6 (six) hours as needed. 02/12/23   Redwine, Madison A, PA-C  icosapent Ethyl (VASCEPA) 1 g capsule Take 2 capsules (2 g total) by mouth 2 (two) times daily. Please make yearly appt with Dr. Eldridge Dace for May before anymore refills. 1st attempt 11/25/19   Dyann Kief, PA-C  lisinopril (ZESTRIL) 5 MG tablet Take 1 tablet (5 mg total) by mouth daily. Please make overdue appt with Dr. Eldridge Dace before anymore refills. Thank you 3rd and Final Attempt 12/12/20   Corky Crafts, MD  metFORMIN (GLUCOPHAGE) 500 MG tablet Take 1 tablet (500 mg total) by mouth 2 (two) times daily with a meal. Patient needs appointment for any future refills. Please call office at [phone] to schedule appointment. 1st attempt. 03/01/21   Corky Crafts, MD  methocarbamol (ROBAXIN) 500 MG tablet Take 1 tablet (500 mg total) by mouth 2 (two) times daily. 02/12/23   Redwine, Madison A, PA-C  metoprolol succinate (TOPROL-XL) 25 MG  24 hr tablet TAKE 1 TABLET BY MOUTH EVERYDAY AT BEDTIME 07/17/19   Corky Crafts, MD  nitroGLYCERIN (NITROSTAT) 0.4 MG SL tablet PLACE 1 TABLET UNDER TONGUE EVERY 5 MINUTES FOR 3 DOSES AS NEEDED FOR CHEST PAIN 03/11/19   Corky Crafts, MD  ondansetron (ZOFRAN) 4 MG tablet Take 1 tablet (4 mg total) by mouth every 6 (six) hours. 02/12/23   Redwine, Madison A, PA-C  pantoprazole (PROTONIX) 40 MG tablet TAKE 1 TABLET BY MOUTH EVERY DAY 01/11/20   Dyann Kief, PA-C      Allergies    Patient has no known allergies.    Review of Systems   Review of Systems   Constitutional:  Negative for activity change, appetite change and fever.  HENT:  Negative for congestion and rhinorrhea.   Respiratory:  Positive for chest tightness. Negative for shortness of breath.   Cardiovascular:  Positive for chest pain.  Gastrointestinal:  Negative for abdominal pain, nausea and vomiting.  Genitourinary:  Negative for dysuria and hematuria.  Musculoskeletal:  Positive for back pain.  Skin:  Negative for rash.  Neurological:  Negative for dizziness, facial asymmetry, weakness and light-headedness.   all other systems are negative except as noted in the HPI and PMH.    Physical Exam Updated Vital Signs BP (!) 152/90   Pulse (!) 59   Temp 97.6 F (36.4 C) (Oral)   Resp 16   Ht 4\' 9"  (1.448 m)   Wt 64.4 kg   SpO2 99%   BMI 30.73 kg/m  Physical Exam Vitals and nursing note reviewed.  Constitutional:      General: He is not in acute distress.    Appearance: He is well-developed.  HENT:     Head: Normocephalic and atraumatic.     Mouth/Throat:     Pharynx: No oropharyngeal exudate.  Eyes:     Conjunctiva/sclera: Conjunctivae normal.     Pupils: Pupils are equal, round, and reactive to light.  Neck:     Comments: No meningismus. Cardiovascular:     Rate and Rhythm: Normal rate and regular rhythm.     Heart sounds: Normal heart sounds. No murmur heard. Pulmonary:     Effort: Pulmonary effort is normal. No respiratory distress.     Breath sounds: Normal breath sounds.     Comments: Some discomfort with range of motion of left arm.  Chest pain not reproducible.  Equal radial pulses Chest:     Chest wall: No tenderness.  Abdominal:     Palpations: Abdomen is soft.     Tenderness: There is no abdominal tenderness. There is no guarding or rebound.  Musculoskeletal:        General: No tenderness. Normal range of motion.     Cervical back: Normal range of motion and neck supple.  Skin:    General: Skin is warm.  Neurological:     Mental Status: He  is alert and oriented to person, place, and time.     Cranial Nerves: No cranial nerve deficit.     Motor: No abnormal muscle tone.     Coordination: Coordination normal.     Comments:  5/5 strength throughout. CN 2-12 intact.Equal grip strength.   Psychiatric:        Behavior: Behavior normal.     ED Results / Procedures / Treatments   Labs (all labs ordered are listed, but only abnormal results are displayed) Labs Reviewed  BASIC METABOLIC PANEL - Abnormal; Notable for the following components:  Result Value   Glucose, Bld 170 (*)    All other components within normal limits  CBC - Abnormal; Notable for the following components:   Hemoglobin 12.8 (*)    MCV 76.2 (*)    MCH 24.3 (*)    All other components within normal limits  SARS CORONAVIRUS 2 BY RT PCR  TROPONIN I (HIGH SENSITIVITY)  TROPONIN I (HIGH SENSITIVITY)  TROPONIN I (HIGH SENSITIVITY)    EKG EKG Interpretation Date/Time:  Sunday July 28 2023 19:54:55 EDT Ventricular Rate:  65 PR Interval:  172 QRS Duration:  84 QT Interval:  364 QTC Calculation: 378 R Axis:   80  Text Interpretation: Normal sinus rhythm Possible Anterior infarct , age undetermined Abnormal ECG When compared with ECG of 05-Jan-2019 07:12, PREVIOUS ECG IS PRESENT T wave changes improved Confirmed by Glynn Octave [phone]) on 07/28/2023 11:40:22 PM  Radiology DG Chest 2 View  Result Date: 07/28/2023 CLINICAL DATA:  Chest pain EXAM: CHEST - 2 VIEW COMPARISON:  02/12/2023 FINDINGS: Frontal and lateral views of the chest demonstrate an unremarkable cardiac silhouette. No acute airspace disease, effusion, or pneumothorax. No acute bony abnormalities. IMPRESSION: 1. No acute intrathoracic process. Electronically Signed   By: Sharlet Salina M.D.   On: 07/28/2023 20:41    Procedures Procedures  {Document cardiac monitor, telemetry assessment procedure when appropriate:1}  Medications Ordered in ED Medications  ketorolac (TORADOL) 15  MG/ML injection 15 mg (has no administration in time range)    ED Course/ Medical Decision Making/ A&P   {   Click here for ABCD2, HEART and other calculatorsREFRESH Note before signing :1}                              Medical Decision Making Amount and/or Complexity of Data Reviewed Labs: ordered. Decision-making details documented in ED Course. Radiology: ordered and independent interpretation performed. Decision-making details documented in ED Course. ECG/medicine tests: ordered and independent interpretation performed. Decision-making details documented in ED Course.  Risk Prescription drug management.   Patient with known CAD and ischemic cardiomyopathy here with left-sided pleuritic chest discomfort for the past 5 days.  This similar to previous MI.  EKG today shows sinus rhythm without acute ST changes.  Chest x-ray negative for infiltrate.  Results reviewed and interpreted by me.  Troponin is negative x 2 with low suspicion for ACS given atypical nature of the pain.  {Document critical care time when appropriate:1} {Document review of labs and clinical decision tools ie heart score, Chads2Vasc2 etc:1}  {Document your independent review of radiology images, and any outside records:1} {Document your discussion with family members, caretakers, and with consultants:1} {Document social determinants of health affecting pt's care:1} {Document your decision making why or why not admission, treatments were needed:1} Final Clinical Impression(s) / ED Diagnoses Final diagnoses:  None    Rx / DC Orders ED Discharge Orders     None

## 2023-07-28 NOTE — ED Triage Notes (Signed)
Pt has been having pain in the upper left chest since Tuesday. Has been having a dry cough and that makes the chest pain worse. Pt is not having any sputum with the cough.

## 2023-07-29 ENCOUNTER — Emergency Department (HOSPITAL_COMMUNITY): Payer: BLUE CROSS/BLUE SHIELD

## 2023-07-29 LAB — TROPONIN I (HIGH SENSITIVITY): Troponin I (High Sensitivity): 5 ng/L (ref <18)

## 2023-07-29 MED ORDER — NAPROXEN 500 MG PO TABS: 500.0000 mg | ORAL_TABLET | Freq: Two times a day (BID) | ORAL | 0 refills | Status: AC | PRN

## 2023-07-29 MED ORDER — IOHEXOL 350 MG/ML SOLN
75.0000 mL | Freq: Once | INTRAVENOUS | Status: AC | PRN
  Administered 2023-07-29: 75 mL via INTRAVENOUS

## 2023-07-29 NOTE — ED Notes (Signed)
Patient transported to CT scan . 

## 2023-07-29 NOTE — Discharge Instructions (Signed)
Testing is negative for heart attack or blood clot in the lung.  Take the anti-inflammatory as prescribed for irritation of the lining of your lung called pleurisy.  You should follow-up with your heart doctor this week.  Return to the ED with exertional chest pain, pain associate with shortness of breath, nausea, vomiting, sweating or any other concerns.
